# Patient Record
Sex: Female | Born: 1970 | Hispanic: Yes | Marital: Married | Smoking: Never smoker
Health system: Southern US, Community
[De-identification: ages and names within clinical notes are randomized; demographics above are authoritative.]

## PROBLEM LIST (undated history)

## (undated) DIAGNOSIS — R519 Headache, unspecified: Secondary | ICD-10-CM

## (undated) DIAGNOSIS — N939 Abnormal uterine and vaginal bleeding, unspecified: Secondary | ICD-10-CM

## (undated) DIAGNOSIS — E785 Hyperlipidemia, unspecified: Secondary | ICD-10-CM

## (undated) DIAGNOSIS — K760 Fatty (change of) liver, not elsewhere classified: Secondary | ICD-10-CM

## (undated) DIAGNOSIS — Z9229 Personal history of other drug therapy: Secondary | ICD-10-CM

## (undated) DIAGNOSIS — R51 Headache: Secondary | ICD-10-CM

## (undated) DIAGNOSIS — R7303 Prediabetes: Secondary | ICD-10-CM

## (undated) DIAGNOSIS — N6092 Unspecified benign mammary dysplasia of left breast: Secondary | ICD-10-CM

## (undated) HISTORY — DX: Fatty (change of) liver, not elsewhere classified: K76.0

## (undated) HISTORY — PX: BREAST SURGERY: SHX581

## (undated) HISTORY — PX: BREAST EXCISIONAL BIOPSY: SUR124

---

## 2016-08-16 HISTORY — PX: BREAST EXCISIONAL BIOPSY: SUR124

## 2017-01-13 ENCOUNTER — Other Ambulatory Visit: Payer: Self-pay | Admitting: Obstetrics and Gynecology

## 2017-01-13 ENCOUNTER — Other Ambulatory Visit: Payer: Self-pay | Admitting: Obstetrics & Gynecology

## 2017-01-13 DIAGNOSIS — N6489 Other specified disorders of breast: Secondary | ICD-10-CM

## 2017-01-27 ENCOUNTER — Encounter (HOSPITAL_COMMUNITY): Payer: Self-pay | Admitting: *Deleted

## 2017-01-27 ENCOUNTER — Ambulatory Visit (HOSPITAL_COMMUNITY)
Admission: RE | Admit: 2017-01-27 | Discharge: 2017-01-27 | Disposition: A | Discharge status: Home / Self Care | Payer: No Typology Code available for payment source | Source: Ambulatory Visit | Attending: Obstetrics and Gynecology | Admitting: Obstetrics and Gynecology

## 2017-01-27 ENCOUNTER — Other Ambulatory Visit: Payer: Self-pay | Admitting: Obstetrics & Gynecology

## 2017-01-27 ENCOUNTER — Ambulatory Visit
Admission: RE | Admit: 2017-01-27 | Discharge: 2017-01-27 | Disposition: A | Discharge status: Home / Self Care | Payer: Self-pay | Source: Ambulatory Visit | Attending: Obstetrics & Gynecology | Admitting: Obstetrics & Gynecology

## 2017-01-27 ENCOUNTER — Other Ambulatory Visit: Payer: Self-pay | Admitting: Obstetrics and Gynecology

## 2017-01-27 ENCOUNTER — Ambulatory Visit
Admission: RE | Admit: 2017-01-27 | Discharge: 2017-01-27 | Disposition: A | Discharge status: Home / Self Care | Payer: No Typology Code available for payment source | Source: Ambulatory Visit | Attending: Obstetrics & Gynecology | Admitting: Obstetrics & Gynecology

## 2017-01-27 VITALS — BP 110/64 | Ht 62.5 in | Wt 131.4 lb

## 2017-01-27 DIAGNOSIS — N6489 Other specified disorders of breast: Secondary | ICD-10-CM

## 2017-01-27 DIAGNOSIS — R928 Other abnormal and inconclusive findings on diagnostic imaging of breast: Secondary | ICD-10-CM

## 2017-01-27 DIAGNOSIS — Z1239 Encounter for other screening for malignant neoplasm of breast: Secondary | ICD-10-CM

## 2017-01-27 NOTE — Progress Notes (Signed)
[facility] due to recommending a right breast biopsy. Right breast diagnostic mammogram completed 01/13/2017.  Pap Smear: Pap smear not completed today. [facility] and normal per patient. Per patient has no history of an abnormal Pap smear. No Pap smear results are in EPIC.  Physical exam: Breasts Breasts symmetrical. No skin abnormalities bilateral breasts. No nipple retraction bilateral breasts. No nipple discharge bilateral breasts. No lymphadenopathy. No lumps palpated bilateral breasts. No complaints of pain or tenderness on exam. [facility] for a right breast biopsy per recommendation. Appointment scheduled for Thursday, January 27, 2017 at 0930.        Pelvic/Bimanual No Pap smear completed today since last Pap smear was in April 2018 per patient. Pap smear not indicated per BCCCP guidelines.   Smoking History: Patient has never smoked.  Patient Navigation: Patient education provided. Access to services provided for patient through [facility] program. Spanish interpreter provided.  Used Spanish interpreter [employer]anca Lindner from [employer].

## 2017-01-27 NOTE — Patient Instructions (Signed)
Explained breast self awareness with Kathryn Cannon. Patient did not need a Pap smear today due to last Pap smear was in April 2018 per patient. Let her know BCCCP will cover Pap smears every 3 years unless has a history of abnormal Pap smears. [facility] right breast biopsy per recommendation. Appointment scheduled for Thursday, January 27, 2017 at 0930. Kathryn Cannon verbalized understanding.  Melynda Krzywicki, Kathaleen Maserhristine Poll, RN 9:37 AM

## 2017-01-28 ENCOUNTER — Encounter (HOSPITAL_COMMUNITY): Payer: Self-pay | Admitting: *Deleted

## 2017-02-14 ENCOUNTER — Ambulatory Visit
Admission: RE | Admit: 2017-02-14 | Discharge: 2017-02-14 | Disposition: A | Discharge status: Home / Self Care | Payer: No Typology Code available for payment source | Source: Ambulatory Visit | Attending: Obstetrics and Gynecology | Admitting: Obstetrics and Gynecology

## 2017-02-14 DIAGNOSIS — R928 Other abnormal and inconclusive findings on diagnostic imaging of breast: Secondary | ICD-10-CM

## 2017-02-14 MED ORDER — GADOBENATE DIMEGLUMINE 529 MG/ML IV SOLN
12.0000 mL | Freq: Once | INTRAVENOUS | Status: AC | PRN
  Administered 2017-02-14: 12 mL via INTRAVENOUS

## 2017-02-14 MED ORDER — GADOBENATE DIMEGLUMINE 529 MG/ML IV SOLN: 12.0000 mL | Freq: Once | INTRAVENOUS | Status: DC | PRN

## 2017-02-15 ENCOUNTER — Other Ambulatory Visit: Payer: Self-pay | Admitting: Obstetrics and Gynecology

## 2017-02-15 ENCOUNTER — Other Ambulatory Visit (HOSPITAL_COMMUNITY): Payer: Self-pay | Admitting: *Deleted

## 2017-02-15 DIAGNOSIS — N6320 Unspecified lump in the left breast, unspecified quadrant: Secondary | ICD-10-CM

## 2017-02-21 ENCOUNTER — Other Ambulatory Visit (HOSPITAL_COMMUNITY): Payer: Self-pay | Admitting: Obstetrics and Gynecology

## 2017-02-21 DIAGNOSIS — N6320 Unspecified lump in the left breast, unspecified quadrant: Secondary | ICD-10-CM

## 2017-03-02 ENCOUNTER — Ambulatory Visit
Admission: RE | Admit: 2017-03-02 | Discharge: 2017-03-02 | Disposition: A | Discharge status: Home / Self Care | Payer: No Typology Code available for payment source | Source: Ambulatory Visit | Attending: Obstetrics and Gynecology | Admitting: Obstetrics and Gynecology

## 2017-03-02 ENCOUNTER — Other Ambulatory Visit: Payer: No Typology Code available for payment source

## 2017-03-02 ENCOUNTER — Other Ambulatory Visit (HOSPITAL_COMMUNITY): Payer: Self-pay | Admitting: Obstetrics and Gynecology

## 2017-03-02 ENCOUNTER — Inpatient Hospital Stay: Admission: RE | Admit: 2017-03-02 | Payer: No Typology Code available for payment source | Source: Ambulatory Visit

## 2017-03-02 DIAGNOSIS — N6320 Unspecified lump in the left breast, unspecified quadrant: Secondary | ICD-10-CM

## 2017-03-02 MED ORDER — GADOBENATE DIMEGLUMINE 529 MG/ML IV SOLN
12.0000 mL | Freq: Once | INTRAVENOUS | Status: AC | PRN
  Administered 2017-03-02: 12 mL via INTRAVENOUS

## 2017-03-04 ENCOUNTER — Other Ambulatory Visit: Payer: Self-pay | Admitting: Obstetrics & Gynecology

## 2017-03-04 DIAGNOSIS — N649 Disorder of breast, unspecified: Secondary | ICD-10-CM

## 2017-03-16 DIAGNOSIS — Z8742 Personal history of other diseases of the female genital tract: Secondary | ICD-10-CM

## 2017-03-16 HISTORY — DX: Personal history of other diseases of the female genital tract: Z87.42

## 2017-04-04 ENCOUNTER — Ambulatory Visit: Payer: Self-pay | Admitting: General Surgery

## 2017-04-04 ENCOUNTER — Other Ambulatory Visit: Payer: Self-pay | Admitting: General Surgery

## 2017-04-04 DIAGNOSIS — N6092 Unspecified benign mammary dysplasia of left breast: Secondary | ICD-10-CM

## 2017-04-22 NOTE — Pre-Procedure Instructions (Signed)
Kathryn Cannon  04/22/2017      [facility] - [place] - [address] [place] [address] Phone: [phone] Fax: [phone]    Your procedure is scheduled on September 12  [facility] at [employer] A.M.  Call this number if you have problems the morning of surgery:  [phone]   Remember:  Do not eat food or drink liquids after midnight.  Continue all other medications as directed by your physician except follow these medication instructions before surgery   Take these medicines the morning of surgery with A SIP OF WATER acetaminophen (TYLENOL)  7 days prior to surgery STOP taking any Aspirin, Aleve, Naproxen, Ibuprofen, Motrin, Advil, Goody's, BC's, all herbal medications, fish oil, and all vitamins    Do not wear jewelry, make-up or nail polish.  Do not wear lotions, powders, or perfumes, or deoderant.  Do not shave 48 hours prior to surgery.  Do not bring valuables to the hospital.  [facility] is not responsible for any belongings or valuables.  Contacts, dentures or bridgework may not be worn into surgery.  Leave your suitcase in the car.  After surgery it may be brought to your room.  For patients admitted to the hospital, discharge time will be determined by your treatment team.  Patients discharged the day of surgery will not be allowed to drive home.   Special instructions:   [place]- Preparing For Surgery  Before surgery, you can play an important role. Because skin is not sterile, your skin needs to be as free of germs as possible. You can reduce the number of germs on your skin by washing with CHG (chlorahexidine gluconate) Soap before surgery.  CHG is an antiseptic cleaner which kills germs and bonds with the skin to continue killing germs even after washing.  Please do not use if you have an allergy to CHG or antibacterial soaps. If your skin becomes reddened/irritated  stop using the CHG.  Do not shave (including legs and underarms) for at least 48 hours prior to first CHG shower. It is OK to shave your face.  Please follow these instructions carefully.   1. Shower the NIGHT BEFORE SURGERY and the MORNING OF SURGERY with CHG.   2. If you chose to wash your hair, wash your hair first as usual with your normal shampoo.  3. After you shampoo, rinse your hair and body thoroughly to remove the shampoo.  4. Use CHG as you would any other liquid soap. You can apply CHG directly to the skin and wash gently with a scrungie or a clean washcloth.   5. Apply the CHG Soap to your body ONLY FROM THE NECK DOWN.  Do not use on open wounds or open sores. Avoid contact with your eyes, ears, mouth and genitals (private parts). Wash genitals (private parts) with your normal soap.  6. Wash thoroughly, paying special attention to the area where your surgery will be performed.  7. Thoroughly rinse your body with warm water from the neck down.  8. DO NOT shower/wash with your normal soap after using and rinsing off the CHG Soap.  9. Pat yourself dry with a CLEAN TOWEL.   10. Wear CLEAN PAJAMAS   11. Place CLEAN SHEETS on your bed the night of your first shower and DO NOT SLEEP WITH PETS.    Day of Surgery: Do not apply any deodorants/lotions. Please wear clean clothes to the  hospital/surgery center.      Please read over the following fact sheets that you were given.

## 2017-04-25 ENCOUNTER — Encounter (HOSPITAL_COMMUNITY): Payer: Self-pay

## 2017-04-25 ENCOUNTER — Ambulatory Visit (HOSPITAL_COMMUNITY)
Admission: RE | Admit: 2017-04-25 | Discharge: 2017-04-25 | Disposition: A | Discharge status: Home / Self Care | Payer: No Typology Code available for payment source | Source: Ambulatory Visit | Attending: General Surgery | Admitting: General Surgery

## 2017-04-25 DIAGNOSIS — N6082 Other benign mammary dysplasias of left breast: Secondary | ICD-10-CM | POA: Insufficient documentation

## 2017-04-25 DIAGNOSIS — Z01818 Encounter for other preprocedural examination: Secondary | ICD-10-CM | POA: Insufficient documentation

## 2017-04-25 HISTORY — DX: Headache, unspecified: R51.9

## 2017-04-25 HISTORY — DX: Headache: R51

## 2017-04-25 LAB — CBC
HCT: 35.5 % — ABNORMAL LOW (ref 36.0–46.0)
HEMOGLOBIN: 11.2 g/dL — AB (ref 12.0–15.0)
MCH: 25.6 pg — AB (ref 26.0–34.0)
MCHC: 31.5 g/dL (ref 30.0–36.0)
MCV: 81.2 fL (ref 78.0–100.0)
Platelets: 303 10*3/uL (ref 150–400)
RBC: 4.37 MIL/uL (ref 3.87–5.11)
RDW: 16.8 % — ABNORMAL HIGH (ref 11.5–15.5)
WBC: 6.4 10*3/uL (ref 4.0–10.5)

## 2017-04-25 LAB — HCG, SERUM, QUALITATIVE: PREG SERUM: NEGATIVE

## 2017-04-25 NOTE — Pre-Procedure Instructions (Addendum)
Kathryn Cannon  04/25/2017      [facility] [place] - [address] [place] [address] Phone: [phone] Fax: [phone]    Your procedure is scheduled on September 12  [facility] Ad[facility] at [employer] A.M.  Call this number if you have problems the morning of surgery:  [phone]   Remember:  Do not eat food or drink liquids after midnight.   Continue all other medications as directed by your physician except follow these medication instructions before surgery   Take these medicines the morning of surgery with A SIP OF WATER acetaminophen (TYLENOL)  Starting today 04/25/17  STOP taking any Aspirin, Aleve, Naproxen, Ibuprofen, Motrin, Advil, Goody's, BC's, all herbal medications, fish oil, and all vitamins    Do not wear jewelry, make-up or nail polish.  Do not wear lotions, powders, or perfumes, or deoderant.  Do not shave 48 hours prior to surgery.  Do not bring valuables to the hospital.  [facility] is not responsible for any belongings or valuables.  Contacts, dentures or bridgework may not be worn into surgery.  Leave your suitcase in the car.  After surgery it may be brought to your room.  For patients admitted to the hospital, discharge time will be determined by your treatment team.  Patients discharged the day of surgery will not be allowed to drive home.   Special instructions:   [place]- Preparing For Surgery  Before surgery, you can play an important role. Because skin is not sterile, your skin needs to be as free of germs as possible. You can reduce the number of germs on your skin by washing with CHG (chlorahexidine gluconate) Soap before surgery.  CHG is an antiseptic cleaner which kills germs and bonds with the skin to continue killing germs even after washing.  Please do not use if you have an allergy to CHG or antibacterial soaps. If your skin becomes  reddened/irritated stop using the CHG.  Do not shave (including legs and underarms) for at least 48 hours prior to first CHG shower. It is OK to shave your face.  Please follow these instructions carefully.   1. Shower the NIGHT BEFORE SURGERY and the MORNING OF SURGERY with CHG.   2. If you chose to wash your hair, wash your hair first as usual with your normal shampoo.  3. After you shampoo, rinse your hair and body thoroughly to remove the shampoo.  4. Use CHG as you would any other liquid soap. You can apply CHG directly to the skin and wash gently with a scrungie or a clean washcloth.   5. Apply the CHG Soap to your body ONLY FROM THE NECK DOWN.  Do not use on open wounds or open sores. Avoid contact with your eyes, ears, mouth and genitals (private parts). Wash genitals (private parts) with your normal soap.  6. Wash thoroughly, paying special attention to the area where your surgery will be performed.  7. Thoroughly rinse your body with warm water from the neck down.  8. DO NOT shower/wash with your normal soap after using and rinsing off the CHG Soap.  9. Pat yourself dry with a CLEAN TOWEL.   10. Wear CLEAN PAJAMAS   11. Place CLEAN SHEETS on your bed the night of your first shower and DO NOT SLEEP WITH PETS.    Day of Surgery: Do not apply any deodorants/lotions. Please wear clean clothes to the  hospital/surgery center.      Please read over the  fact sheets that you were given.

## 2017-04-25 NOTE — Progress Notes (Signed)
Interpreter services used on stratus-cynthia [id number]

## 2017-04-26 ENCOUNTER — Ambulatory Visit
Admission: RE | Admit: 2017-04-26 | Discharge: 2017-04-26 | Disposition: A | Discharge status: Home / Self Care | Payer: No Typology Code available for payment source | Source: Ambulatory Visit | Attending: General Surgery | Admitting: General Surgery

## 2017-04-26 DIAGNOSIS — N6092 Unspecified benign mammary dysplasia of left breast: Secondary | ICD-10-CM

## 2017-04-26 NOTE — Anesthesia Preprocedure Evaluation (Addendum)
Anesthesia Evaluation  Patient identified by MRN, date of birth, ID band Patient awake    Reviewed: Allergy & Precautions, H&P , NPO status , Patient's Chart, lab work & pertinent test results  Airway Mallampati: II  TM Distance: >3 FB Neck ROM: Full    Dental no notable dental hx. (+) Teeth Intact, Dental Advisory Given   Pulmonary neg pulmonary ROS,    Pulmonary exam normal breath sounds clear to auscultation       Cardiovascular negative cardio ROS   Rhythm:Regular Rate:Normal     Neuro/Psych  Headaches, negative psych ROS   GI/Hepatic negative GI ROS, Neg liver ROS,   Endo/Other  negative endocrine ROS  Renal/GU negative Renal ROS  negative genitourinary   Musculoskeletal   Abdominal   Peds  Hematology negative hematology ROS (+)   Anesthesia Other Findings   Reproductive/Obstetrics negative OB ROS                            Anesthesia Physical Anesthesia Plan  ASA: II  Anesthesia Plan: General   Post-op Pain Management:    Induction: Intravenous  PONV Risk Score and Plan: 4 or greater and Ondansetron, Dexamethasone and Midazolam  Airway Management Planned: LMA  Additional Equipment:   Intra-op Plan:   Post-operative Plan: Extubation in OR  Informed Consent: I have reviewed the patients History and Physical, chart, labs and discussed the procedure including the risks, benefits and alternatives for the proposed anesthesia with the patient or authorized representative who has indicated his/her understanding and acceptance.     Dental advisory given  Plan Discussed with: CRNA  Anesthesia Plan Comments:         Anesthesia Quick Evaluation  

## 2017-04-27 ENCOUNTER — Encounter (HOSPITAL_COMMUNITY)
Admission: RE | Disposition: A | Discharge status: Home / Self Care | Payer: Self-pay | Source: Ambulatory Visit | Attending: General Surgery

## 2017-04-27 ENCOUNTER — Ambulatory Visit (HOSPITAL_COMMUNITY): Payer: Self-pay | Admitting: Anesthesiology

## 2017-04-27 ENCOUNTER — Encounter (HOSPITAL_COMMUNITY): Payer: Self-pay | Admitting: Certified Registered Nurse Anesthetist

## 2017-04-27 ENCOUNTER — Ambulatory Visit (HOSPITAL_COMMUNITY)
Admission: RE | Admit: 2017-04-27 | Discharge: 2017-04-27 | Disposition: A | Discharge status: Home / Self Care | Payer: Self-pay | Source: Ambulatory Visit | Attending: General Surgery | Admitting: General Surgery

## 2017-04-27 ENCOUNTER — Ambulatory Visit
Admission: RE | Admit: 2017-04-27 | Discharge: 2017-04-27 | Disposition: A | Discharge status: Home / Self Care | Payer: No Typology Code available for payment source | Source: Ambulatory Visit | Attending: General Surgery | Admitting: General Surgery

## 2017-04-27 DIAGNOSIS — N6082 Other benign mammary dysplasias of left breast: Secondary | ICD-10-CM | POA: Insufficient documentation

## 2017-04-27 DIAGNOSIS — N6092 Unspecified benign mammary dysplasia of left breast: Secondary | ICD-10-CM

## 2017-04-27 DIAGNOSIS — N6022 Fibroadenosis of left breast: Secondary | ICD-10-CM | POA: Insufficient documentation

## 2017-04-27 HISTORY — DX: Unspecified benign mammary dysplasia of left breast: N60.92

## 2017-04-27 HISTORY — PX: BREAST LUMPECTOMY WITH RADIOACTIVE SEED LOCALIZATION: SHX6424

## 2017-04-27 SURGERY — BREAST LUMPECTOMY WITH RADIOACTIVE SEED LOCALIZATION
Anesthesia: General | Site: Breast | Laterality: Left

## 2017-04-27 MED ORDER — CEFAZOLIN SODIUM-DEXTROSE 2-4 GM/100ML-% IV SOLN
2.0000 g | INTRAVENOUS | Status: AC
  Administered 2017-04-27: 2 g via INTRAVENOUS
  Filled 2017-04-27: qty 100

## 2017-04-27 MED ORDER — MIDAZOLAM HCL 5 MG/5ML IJ SOLN
INTRAMUSCULAR | Status: DC | PRN
  Administered 2017-04-27: 2 mg via INTRAVENOUS

## 2017-04-27 MED ORDER — PROPOFOL 10 MG/ML IV BOLUS
INTRAVENOUS | Status: DC | PRN
  Administered 2017-04-27: 140 mg via INTRAVENOUS

## 2017-04-27 MED ORDER — LIDOCAINE 2% (20 MG/ML) 5 ML SYRINGE
INTRAMUSCULAR | Status: AC
  Filled 2017-04-27: qty 5

## 2017-04-27 MED ORDER — CELECOXIB 200 MG PO CAPS
400.0000 mg | ORAL_CAPSULE | ORAL | Status: AC
  Administered 2017-04-27: 400 mg via ORAL
  Filled 2017-04-27: qty 2

## 2017-04-27 MED ORDER — 0.9 % SODIUM CHLORIDE (POUR BTL) OPTIME
TOPICAL | Status: DC | PRN
Start: 2017-04-27 — End: 2017-04-27
  Administered 2017-04-27: 1000 mL

## 2017-04-27 MED ORDER — FENTANYL CITRATE (PF) 250 MCG/5ML IJ SOLN
INTRAMUSCULAR | Status: AC
  Filled 2017-04-27: qty 5

## 2017-04-27 MED ORDER — ONDANSETRON HCL 4 MG/2ML IJ SOLN
INTRAMUSCULAR | Status: DC | PRN
  Administered 2017-04-27: 4 mg via INTRAVENOUS

## 2017-04-27 MED ORDER — LIDOCAINE 2% (20 MG/ML) 5 ML SYRINGE
INTRAMUSCULAR | Status: DC | PRN
  Administered 2017-04-27: 60 mg via INTRAVENOUS

## 2017-04-27 MED ORDER — LACTATED RINGERS IV SOLN
INTRAVENOUS | Status: DC | PRN
  Administered 2017-04-27: 08:00:00 via INTRAVENOUS

## 2017-04-27 MED ORDER — HYDROMORPHONE HCL 1 MG/ML IJ SOLN
INTRAMUSCULAR | Status: AC
  Filled 2017-04-27: qty 1

## 2017-04-27 MED ORDER — CHLORHEXIDINE GLUCONATE CLOTH 2 % EX PADS
6.0000 | MEDICATED_PAD | Freq: Once | CUTANEOUS | Status: DC
Start: 2017-04-27 — End: 2017-04-27

## 2017-04-27 MED ORDER — PROPOFOL 10 MG/ML IV BOLUS
INTRAVENOUS | Status: AC
  Filled 2017-04-27: qty 40

## 2017-04-27 MED ORDER — HYDROMORPHONE HCL 1 MG/ML IJ SOLN
0.2500 mg | INTRAMUSCULAR | Status: DC | PRN
  Administered 2017-04-27: 0.5 mg via INTRAVENOUS

## 2017-04-27 MED ORDER — CHLORHEXIDINE GLUCONATE CLOTH 2 % EX PADS: 6.0000 | MEDICATED_PAD | Freq: Once | CUTANEOUS | Status: DC

## 2017-04-27 MED ORDER — DEXAMETHASONE SODIUM PHOSPHATE 10 MG/ML IJ SOLN
INTRAMUSCULAR | Status: AC
  Filled 2017-04-27: qty 1

## 2017-04-27 MED ORDER — BUPIVACAINE-EPINEPHRINE (PF) 0.25% -1:200000 IJ SOLN
INTRAMUSCULAR | Status: AC
  Filled 2017-04-27: qty 30

## 2017-04-27 MED ORDER — ONDANSETRON HCL 4 MG/2ML IJ SOLN
INTRAMUSCULAR | Status: AC
  Filled 2017-04-27: qty 2

## 2017-04-27 MED ORDER — DEXAMETHASONE SODIUM PHOSPHATE 10 MG/ML IJ SOLN
INTRAMUSCULAR | Status: DC | PRN
  Administered 2017-04-27: 10 mg via INTRAVENOUS

## 2017-04-27 MED ORDER — GABAPENTIN 300 MG PO CAPS
300.0000 mg | ORAL_CAPSULE | ORAL | Status: AC
  Administered 2017-04-27: 300 mg via ORAL
  Filled 2017-04-27: qty 1

## 2017-04-27 MED ORDER — BUPIVACAINE-EPINEPHRINE 0.25% -1:200000 IJ SOLN
INTRAMUSCULAR | Status: DC | PRN
Start: 2017-04-27 — End: 2017-04-27
  Administered 2017-04-27: 20 mL

## 2017-04-27 MED ORDER — FENTANYL CITRATE (PF) 100 MCG/2ML IJ SOLN
INTRAMUSCULAR | Status: DC | PRN
  Administered 2017-04-27: 25 ug via INTRAVENOUS
  Administered 2017-04-27: 50 ug via INTRAVENOUS

## 2017-04-27 MED ORDER — ACETAMINOPHEN 500 MG PO TABS
1000.0000 mg | ORAL_TABLET | ORAL | Status: AC
  Administered 2017-04-27: 1000 mg via ORAL
  Filled 2017-04-27: qty 2

## 2017-04-27 MED ORDER — MIDAZOLAM HCL 2 MG/2ML IJ SOLN
INTRAMUSCULAR | Status: AC
  Filled 2017-04-27: qty 2

## 2017-04-27 MED ORDER — HYDROCODONE-ACETAMINOPHEN 5-325 MG PO TABS: 1.0000 | ORAL_TABLET | ORAL | 0 refills | Status: DC | PRN

## 2017-04-27 SURGICAL SUPPLY — 37 items
APPLIER CLIP 9.375 MED OPEN (MISCELLANEOUS)
BLADE SURG 15 STRL LF DISP TIS (BLADE) ×1 IMPLANT
BLADE SURG 15 STRL SS (BLADE) ×2
CANISTER SUCT 3000ML PPV (MISCELLANEOUS) ×3 IMPLANT
CHLORAPREP W/TINT 26ML (MISCELLANEOUS) ×3 IMPLANT
CLIP APPLIE 9.375 MED OPEN (MISCELLANEOUS) IMPLANT
COVER PROBE W GEL 5X96 (DRAPES) ×3 IMPLANT
COVER SURGICAL LIGHT HANDLE (MISCELLANEOUS) ×3 IMPLANT
DERMABOND ADVANCED (GAUZE/BANDAGES/DRESSINGS) ×2
DERMABOND ADVANCED .7 DNX12 (GAUZE/BANDAGES/DRESSINGS) ×1 IMPLANT
DEVICE DUBIN SPECIMEN MAMMOGRA (MISCELLANEOUS) ×3 IMPLANT
DRAPE CHEST BREAST 15X10 FENES (DRAPES) ×3 IMPLANT
DRAPE UTILITY XL STRL (DRAPES) ×3 IMPLANT
ELECT COATED BLADE 2.86 ST (ELECTRODE) ×3 IMPLANT
ELECT REM PT RETURN 9FT ADLT (ELECTROSURGICAL) ×3
ELECTRODE REM PT RTRN 9FT ADLT (ELECTROSURGICAL) ×1 IMPLANT
GLOVE BIO SURGEON STRL SZ7.5 (GLOVE) ×6 IMPLANT
GOWN STRL REUS W/ TWL LRG LVL3 (GOWN DISPOSABLE) ×2 IMPLANT
GOWN STRL REUS W/TWL LRG LVL3 (GOWN DISPOSABLE) ×4
KIT BASIN OR (CUSTOM PROCEDURE TRAY) ×3 IMPLANT
KIT MARKER MARGIN INK (KITS) ×3 IMPLANT
LIGHT WAVEGUIDE WIDE FLAT (MISCELLANEOUS) IMPLANT
NEEDLE HYPO 25GX1X1/2 BEV (NEEDLE) ×3 IMPLANT
NS IRRIG 1000ML POUR BTL (IV SOLUTION) ×3 IMPLANT
PACK SURGICAL SETUP 50X90 (CUSTOM PROCEDURE TRAY) ×3 IMPLANT
PENCIL BUTTON HOLSTER BLD 10FT (ELECTRODE) ×3 IMPLANT
SPONGE LAP 18X18 X RAY DECT (DISPOSABLE) ×3 IMPLANT
SUT MNCRL AB 4-0 PS2 18 (SUTURE) ×3 IMPLANT
SUT SILK 2 0 SH (SUTURE) IMPLANT
SUT VIC AB 3-0 SH 18 (SUTURE) ×3 IMPLANT
SYR BULB 3OZ (MISCELLANEOUS) ×3 IMPLANT
SYR CONTROL 10ML LL (SYRINGE) ×3 IMPLANT
TOWEL OR 17X24 6PK STRL BLUE (TOWEL DISPOSABLE) ×3 IMPLANT
TOWEL OR 17X26 10 PK STRL BLUE (TOWEL DISPOSABLE) ×3 IMPLANT
TUBE CONNECTING 12'X1/4 (SUCTIONS) ×1
TUBE CONNECTING 12X1/4 (SUCTIONS) ×2 IMPLANT
YANKAUER SUCT BULB TIP NO VENT (SUCTIONS) ×3 IMPLANT

## 2017-04-27 NOTE — Anesthesia Procedure Notes (Signed)
Procedure Name: LMA Insertion Date/Time: 04/27/2017 8:34 AM Performed by: Jed LimerickHARDER, Doralee Kocak S Pre-anesthesia Checklist: Patient identified, Emergency Drugs available, Suction available and Patient being monitored Patient Re-evaluated:Patient Re-evaluated prior to induction Oxygen Delivery Method: Circle System Utilized Preoxygenation: Pre-oxygenation with 100% oxygen Induction Type: IV induction Ventilation: Mask ventilation without difficulty LMA: LMA inserted LMA Size: 4.0 Number of attempts: 1 Placement Confirmation: positive ETCO2 Tube secured with: Tape Dental Injury: Teeth and Oropharynx as per pre-operative assessment

## 2017-04-27 NOTE — Op Note (Signed)
04/27/2017  9:19 AM  PATIENT:  Kathryn Cannon  46 y.o. female  PRE-OPERATIVE DIAGNOSIS:  LEFT BREAST ATYPICAL DUCTAL HYPERPLASIA  POST-OPERATIVE DIAGNOSIS:  LEFT BREAST ATYPICAL DUCTAL HYPERPLASIA  PROCEDURE:  Procedure(s): RADIOCATIVE SEED GUIDED LEFT BREAST LUMPECTOMY  SURGEON:  Surgeon(s) and Role:    * Griselda Mineroth, Paul III, MD - Primary  PHYSICIAN ASSISTANT:   ASSISTANTS: none   ANESTHESIA:   local and general  EBL:  Total I/O In: 600 [I.V.:600] Out: 2 [Blood:2]  BLOOD ADMINISTERED:none  DRAINS: none   LOCAL MEDICATIONS USED:  MARCAINE     SPECIMEN:  Source of Specimen:  left breast tissue  DISPOSITION OF SPECIMEN:  PATHOLOGY  COUNTS:  YES  TOURNIQUET:  * No tourniquets in log *  DICTATION: .Dragon Dictation   After informed consent was obtained the patient was brought to the operating room and placed in the supine position on the operating table. After adequate induction of general anesthesia the patient's left breast was prepped with ChloraPrep, allowed to dry, and draped in usual sterile manner. An appropriate timeout was performed. Previously an I-125 seed was placed in the upper inner aspect of the left breast to mark an area of atypical ductal hyperplasia. The neoprobe was set to I-125 in the area of radioactivity was readily identified. The area around this was infiltrated with quarter percent Marcaine. A curvilinear incision was made along the upper inner edge of the areola of the left breast with a 15 blade knife. The incision was carried through the skin and subcutaneous tissue sharply with electrocautery. Dissection was then carried towards the radioactive seed under the direction of the neoprobe. Once I approached the radioactive seed then a circular portion of breast tissue was then excised sharply around the radioactive seed while checking the area of radioactivity frequently. Once the specimen was removed it was oriented with the appropriate paint  colors. A specimen radiograph was obtained that showed the clip and seed to be within the center of the specimen. Hemostasis was achieved using the Bovie electrocautery. The wound was then infiltrated with more quarter percent Marcaine and irrigated with saline. The deep layer of the wound was then closed with layers of interrupted 3-0 Vicryl stitches. The skin was then closed with interrupted 4-0 Monocryl subcuticular stitches. Dermabond dressings were applied. The patient tolerated the procedure well. At the end of the case all needle sponge and instrument counts were correct. The patient was then awakened and taken to recovery in stable condition.  PLAN OF CARE: Discharge to home after PACU  PATIENT DISPOSITION:  PACU - hemodynamically stable.   Delay start of Pharmacological VTE agent (>24hrs) due to surgical blood loss or risk of bleeding: not applicable

## 2017-04-27 NOTE — Anesthesia Postprocedure Evaluation (Signed)
Anesthesia Post Note  Patient: Kathryn Cannon  Procedure(s) Performed: Procedure(s) (LRB): RADIOCATIVE SEED GUIDED LEFT BREAST LUMPECTOMY, ERAS PATHWAY (Left)     Patient location during evaluation: PACU Anesthesia Type: General Level of consciousness: awake and alert Pain management: pain level controlled Vital Signs Assessment: post-procedure vital signs reviewed and stable Respiratory status: spontaneous breathing, nonlabored ventilation and respiratory function stable Cardiovascular status: blood pressure returned to baseline and stable Postop Assessment: no signs of nausea or vomiting Anesthetic complications: no    Last Vitals:  Vitals:   04/27/17 1030 04/27/17 1040  BP: 112/78 116/73  Pulse: 70 70  Resp: 16 14  Temp:  36.8 C  SpO2: 99% 99%    Last Pain:  Vitals:   04/27/17 1045  TempSrc:   PainSc: 0-No pain                 Chelesea Weiand,W. EDMOND

## 2017-04-27 NOTE — Progress Notes (Signed)
Interpreter Graciela Namihira for pre surgery   °

## 2017-04-27 NOTE — Transfer of Care (Signed)
Immediate Anesthesia Transfer of Care Note  Patient: Kathryn Cannon  Procedure(s) Performed: Procedure(s): RADIOCATIVE SEED GUIDED LEFT BREAST LUMPECTOMY, ERAS PATHWAY (Left)  Patient Location: PACU  Anesthesia Type:General  Level of Consciousness: awake, alert  and oriented  Airway & Oxygen Therapy: Patient Spontanous Breathing and Patient connected to nasal cannula oxygen  Post-op Assessment: Report given to RN and Post -op Vital signs reviewed and stable  Post vital signs: Reviewed and stable  Last Vitals:  Vitals:   04/27/17 0655  BP: 131/74  Pulse: 75  Resp: 18  Temp: 36.9 C  SpO2: 100%    Last Pain:  Vitals:   04/27/17 0655  TempSrc: Oral      Patients Stated Pain Goal: 3 (04/27/17 0655)  Complications: No apparent anesthesia complications

## 2017-04-27 NOTE — Progress Notes (Signed)
Interpreter in to see pt 6/10 paon score medicated for pain

## 2017-04-27 NOTE — Interval H&P Note (Signed)
History and Physical Interval Note:  04/27/2017 8:09 AM  Kathryn Cannon  has presented today for surgery, with the diagnosis of left breast atypical ductal hyperplasia  The various methods of treatment have been discussed with the patient and family. After consideration of risks, benefits and other options for treatment, the patient has consented to  Procedure(s): LEFT BREAST LUMPECTOMY WITH RADIOACTIVE SEED LOCALIZATION ERAS PATHWAY (Left) as a surgical intervention .  The patient's history has been reviewed, patient examined, no change in status, stable for surgery.  I have reviewed the patient's chart and labs.  Questions were answered to the patient's satisfaction.     TOTH III,Amiliana Foutz S

## 2017-04-27 NOTE — H&P (Signed)
Kathryn Cannon  Location: [facility] Patient #: [id number] DOB: 03/28/1971 Married / Language: Spanish / Race: Undefined Female   History of Present Illness  The patient is a 46 year old female who presents with a breast mass. We are asked to see the patient in consultation by Dr. Derinda LateJarosz to evaluate her for atypical ductal hyperplasia of the left breast. The patient is a 46 year old Hispanic female who recently went for a routine screening mammogram. At that time she was found to have 2 abnormalities in the upper left breast. 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 The upper inner area was found to be atypical duct hyperplasia. She denies any personal or family history of breast cancer. She is otherwise healthy.   Past Surgical History Breast Biopsy  Left.  Allergies NKDA  Medication History No Current Medications Medications Reconciled  Social History  No alcohol use  No caffeine use  No drug use  Tobacco use  Never smoker.  Pregnancy / Birth History  Gravida  7 Maternal age  44-20 Para  3 Regular periods     Review of Systems  Skin Not Present- Change in Wart/Mole, Dryness, Hives, Jaundice, New Lesions, Non-Healing Wounds, Rash and Ulcer. Respiratory Not Present- Bloody sputum, Chronic Cough, Difficulty Breathing, Snoring and Wheezing. Breast Not Present- Breast Mass, Breast Pain, Nipple Discharge and Skin Changes. Cardiovascular Not Present- Chest Pain, Difficulty Breathing Lying Down, Leg Cramps, Palpitations, Rapid Heart Rate, Shortness of Breath and Swelling of Extremities. Gastrointestinal Not Present- Abdominal Pain, Bloating, Bloody Stool, Change in Bowel Habits, Chronic diarrhea, Constipation, Difficulty Swallowing, Excessive gas, Gets full quickly at meals, Hemorrhoids, Indigestion, Nausea, Rectal Pain and Vomiting. Musculoskeletal Not Present- Back Pain, Joint Pain, Joint Stiffness, Muscle Pain,  Muscle Weakness and Swelling of Extremities.  Vitals  Weight: 135 lb Height: 61.5in Body Surface Area: 1.61 m Body Mass Index: 25.09 kg/m  Temp.: 97.57F  Pulse: 47 (Regular)  BP: 106/70 (Sitting, Left Arm, Standard)       Physical Exam  General Mental Status-Alert. General Appearance-Consistent with stated age. Hydration-Well hydrated. Voice-Normal.  Head and Neck Head-normocephalic, atraumatic with no lesions or palpable masses. Trachea-midline. Thyroid Gland Characteristics - normal size and consistency.  Eye Eyeball - Bilateral-Extraocular movements intact. Sclera/Conjunctiva - Bilateral-No scleral icterus.  Chest and Lung Exam Chest and lung exam reveals -quiet, even and easy respiratory effort with no use of accessory muscles and on auscultation, normal breath sounds, no adventitious sounds and normal vocal resonance. Inspection Chest Wall - Normal. Back - normal.  Breast Note: There is no palpable mass in either breast. There is no palpable axillary, supraclavicular, or cervical lymphadenopathy.   Cardiovascular Cardiovascular examination reveals -normal heart sounds, regular rate and rhythm with no murmurs and normal pedal pulses bilaterally.  Abdomen Inspection Inspection of the abdomen reveals - No Hernias. Skin - Scar - no surgical scars. Palpation/Percussion Palpation and Percussion of the abdomen reveal - Soft, Non Tender, No Rebound tenderness, No Rigidity (guarding) and No hepatosplenomegaly. Auscultation Auscultation of the abdomen reveals - Bowel sounds normal.  Neurologic Neurologic evaluation reveals -alert and oriented x 3 with no impairment of recent or remote memory. Mental Status-Normal.  Musculoskeletal Normal Exam - Left-Upper Extremity Strength Normal and Lower Extremity Strength Normal. Normal Exam - Right-Upper Extremity Strength Normal and Lower Extremity Strength Normal.  Lymphatic Head  & Neck  General Head & Neck Lymphatics: Bilateral - Description - Normal. Axillary  General Axillary Region: Bilateral - Description -  Normal. Tenderness - Non Tender. Femoral & Inguinal  Generalized Femoral & Inguinal Lymphatics: Bilateral - Description - Normal. Tenderness - Non Tender.    Assessment & Plan  ATYPICAL DUCTAL HYPERPLASIA OF LEFT BREAST (N60.92) Impression: The patient appears to have a small area of atypical ductal hyperplasia in the left upper inner quadrant. Because of its abnormal appearance and because it is considered a high risk lesion and I would recommend having this area removed. I have discussed with her in detail the risks and benefits of the operation to do this as well as some of the technical aspects and she understands and wishes to proceed. I will plan for a left breast radioactive seed localized lumpectomy. We discussed all this through the interpreter and she understands and her questions were answered to her satisfaction.

## 2017-04-28 ENCOUNTER — Encounter (HOSPITAL_COMMUNITY): Payer: Self-pay | Admitting: General Surgery

## 2017-05-16 ENCOUNTER — Encounter: Payer: Self-pay | Admitting: Oncology

## 2017-05-16 ENCOUNTER — Telehealth: Payer: Self-pay | Admitting: Oncology

## 2017-05-16 NOTE — Telephone Encounter (Signed)
Appt has been scheduled for the pt to see Dr. Darnelle Catalan on 10/29 at 4pm. Letter mailed to the pt.

## 2017-06-13 ENCOUNTER — Ambulatory Visit (HOSPITAL_BASED_OUTPATIENT_CLINIC_OR_DEPARTMENT_OTHER): Payer: Self-pay | Admitting: Oncology

## 2017-06-13 ENCOUNTER — Telehealth: Payer: Self-pay | Admitting: Oncology

## 2017-06-13 DIAGNOSIS — Z79810 Long term (current) use of selective estrogen receptor modulators (SERMs): Secondary | ICD-10-CM

## 2017-06-13 DIAGNOSIS — N6092 Unspecified benign mammary dysplasia of left breast: Secondary | ICD-10-CM | POA: Insufficient documentation

## 2017-06-13 MED ORDER — TAMOXIFEN CITRATE 20 MG PO TABS: 20.0000 mg | ORAL_TABLET | Freq: Every day | ORAL | 12 refills | Status: AC

## 2017-06-13 NOTE — Telephone Encounter (Signed)
Gave patient avs and calendar with appts per 10/29 los. °

## 2017-06-13 NOTE — Progress Notes (Signed)
[place]  Telephone:([phone] Fax:(336) [phone]     ID: Kathryn Cannon DOB: Jul 26, 1971  MR#: [id number]  JYN#:[id number]  Patient Care Team: Kathryn Bellman, MD as PCP - General (Obstetrics and Gynecology) Kathryn Cannon, Kathryn Dad, MD as Consulting Physician (Oncology) Kathryn Kussmaul, MD as Consulting Physician (General Surgery) Kathryn Cruel, MD OTHER MD:  CHIEF COMPLAINT: Atypical ductal hyperplasia  CURRENT TREATMENT: To start tamoxifen   HISTORY OF CURRENT ILLNESS: The patient had screening mammography Dec 28, 2016 showing a possible change in the right breast.  On 01/12/2017 she had right diagnostic mammography with tomography and right breast ultrasonography at [facility] in [place].  This showed the breast density to be category C.  In the upper inner right breast there was an area of persistent distortion.  However there was no ultrasound correlate.  The right axilla was sonographically benign.   On 01/27/2017 the patient attempted right breast biopsy under tomo guidance but the abnormality could not be located so she proceeded to bilateral breast MRI February 14, 2017.  In the right breast there was no MRI correlate for the possible area of architectural distortion.  In the left breast however there was an oval circumscribed mass in the upper inner quadrant measuring 0.7 cm.  There was a second focal area of non-mass-like enhancement in the upper outer quadrant of the left breast measuring 0.8 cm.  There were no abnormal-appearing lymph nodes.  Biopsy of the 2 left breast areas in question was performed March 02, 2017 and showed ([id number] fibrocystic change in the more outer biopsy and atypical ductal hyperplasia in the more inner located biopsy.  The patient then proceeded to left lumpectomy on April 27, 2017.  This showed ([id number]) fibrocystic changes, but no evidence of malignancy.  She was referred to the high risk clinic for further  evaluation  The patient's subsequent history is as detailed below.  INTERVAL HISTORY: I met with the patient and her husband Kathryn Cannon in the high-risk clinic June 13, 2017   REVIEW OF SYSTEMS: There were no specific symptoms leading to the original mammogram, which was routinely scheduled. The patient denies unusual headaches, visual changes, nausea, vomiting, stiff neck, dizziness, or gait imbalance. There has been no cough, phlegm production, or pleurisy, no chest pain or pressure, and no change in bowel or bladder habits. The patient denies fever, rash, bleeding, unexplained fatigue or unexplained weight loss. A detailed review of systems was otherwise entirely negative.   PAST MEDICAL HISTORY: Past Medical History:  Diagnosis Date  . Atypical ductal hyperplasia of left breast   . Headache     PAST SURGICAL HISTORY: Past Surgical History:  Procedure Laterality Date  . BREAST LUMPECTOMY WITH RADIOACTIVE SEED LOCALIZATION Left 04/27/2017   Procedure: RADIOCATIVE SEED GUIDED LEFT BREAST LUMPECTOMY, ERAS PATHWAY;  Surgeon: Kathryn Kussmaul, MD;  Location: [facility];  Service: General;  Laterality: Left;  . CESAREAN SECTION      FAMILY HISTORY Family History  Problem Relation Age of Onset  . Diabetes Paternal Aunt   As of October 2018 the patient's father is 75 years old and her mother 51 years old.  The patient has 6 brothers and 3 sisters.  There is no history of cancer in the family or the extended family to the best of her knowledge  GYNECOLOGIC HISTORY:  No LMP recorded. Menarche age 64, first live birth age 16, she is Kathryn Cannon.  She is still having regular periods, which usually last  3-4 days, 1 of which is heavy  SOCIAL HISTORY:  She is originally from [place] as is her husband.  They have been in the hospital area more than 20 years.  She is a housewife.  There are 3 sons are 58, living in [place], [place], living in [place], and 44 years old    ADVANCED DIRECTIVES:     HEALTH MAINTENANCE: Social History  Substance Use Topics  . Smoking status: Never Smoker  . Smokeless tobacco: Never Used  . Alcohol use No     Colonoscopy:  PAP:  Bone density:   No Known Allergies  Current Outpatient Prescriptions  Medication Sig Dispense Refill  . acetaminophen (TYLENOL) 500 MG tablet Take 1,000 mg by mouth 2 (two) times daily as needed for mild pain or headache.    Marland Kitchen HYDROcodone-acetaminophen (NORCO/VICODIN) 5-325 MG tablet Take 1-2 tablets by mouth every 4 (four) hours as needed for moderate pain or severe pain. 10 tablet 0  . Magnesium Oxide 500 MG CAPS Take 500 mg by mouth daily.    . tamoxifen (NOLVADEX) 20 MG tablet Take 1 tablet (20 mg total) by mouth daily. 90 tablet 12   No current facility-administered medications for this visit.     OBJECTIVE: Hispanic woman who appears well  Vitals:   06/13/17 1555  BP: 125/66  Pulse: 71  Resp: 18  Temp: 98.2 F (36.8 C)  SpO2: 100%     Body mass index is 23.55 kg/m.   Wt Readings from Last 3 Encounters:  06/13/17 141 lb 8 oz (64.2 kg)  04/27/17 137 lb (62.1 kg)  04/25/17 137 lb 6 oz (62.3 kg)      ECOG FS:0 - Asymptomatic  Ocular: Sclerae unicteric, pupils round and equal Ear-nose-throat: Oropharynx clear and moist Lymphatic: No cervical or supraclavicular adenopathy Lungs no rales or rhonchi Heart regular rate and rhythm Abd soft, nontender, positive bowel sounds MSK no focal spinal tenderness, no joint edema Neuro: non-focal, well-oriented, appropriate affect Breasts: The right breast is unremarkable.  The left breast is status post recent lumpectomy.  The cosmetic result is excellent.  The incision is healing very nicely.  There is no evidence of local recurrence or residual disease noted both axillae are benign   LAB RESULTS:  CMP  No results found for: NA, K, CL, CO2, GLUCOSE, BUN, CREATININE, CALCIUM, PROT, ALBUMIN, AST, ALT, ALKPHOS, BILITOT, GFRNONAA, GFRAA  No results found  for: TOTALPROTELP, ALBUMINELP, A1GS, A2GS, BETS, BETA2SER, GAMS, MSPIKE, SPEI  No results found for: KPAFRELGTCHN, LAMBDASER, KAPLAMBRATIO  Lab Results  Component Value Date   WBC 6.4 04/25/2017   HGB 11.2 (L) 04/25/2017   HCT 35.5 (L) 04/25/2017   MCV 81.2 04/25/2017   PLT 303 04/25/2017      Chemistry   No results found for: NA, K, CL, CO2, BUN, CREATININE, GLU No results found for: CALCIUM, ALKPHOS, AST, ALT, BILITOT     No results found for: LABCA2  No components found for: [id number]  No results for input(s): INR in the last 168 hours.  No results found for: LABCA2  No results found for: HCW237  No results found for: SEG315  No results found for: VVO160  No results found for: CA2729  No components found for: HGQUANT  No results found for: CEA1 / No results found for: CEA1   No results found for: AFPTUMOR  No results found for: CHROMOGRNA  No results found for: PSA1  No visits with results within 3 Day(s) from this visit.  Latest known visit with results is:  Hospital Outpatient Visit on 04/25/2017  Component Date Value Ref Range Status  . WBC 04/25/2017 6.4  4.0 - 10.5 K/uL Final  . RBC 04/25/2017 4.37  3.87 - 5.11 MIL/uL Final  . Hemoglobin 04/25/2017 11.2* 12.0 - 15.0 g/dL Final  . HCT 04/25/2017 35.5* 36.0 - 46.0 % Final  . MCV 04/25/2017 81.2  78.0 - 100.0 fL Final  . MCH 04/25/2017 25.6* 26.0 - 34.0 pg Final  . MCHC 04/25/2017 31.5  30.0 - 36.0 g/dL Final  . RDW 04/25/2017 16.8* 11.5 - 15.5 % Final  . Platelets 04/25/2017 303  150 - 400 K/uL Final  . Preg, Serum 04/25/2017 NEGATIVE  NEGATIVE Final   Comment:        THE SENSITIVITY OF THIS METHODOLOGY IS >10 mIU/mL.     (this displays the last labs from the last 3 days)  No results found for: TOTALPROTELP, ALBUMINELP, A1GS, A2GS, BETS, BETA2SER, GAMS, MSPIKE, SPEI (this displays SPEP labs)  No results found for: KPAFRELGTCHN, LAMBDASER, KAPLAMBRATIO (kappa/lambda light chains)  No results  found for: HGBA, HGBA2QUANT, HGBFQUANT, HGBSQUAN (Hemoglobinopathy evaluation)   No results found for: LDH  No results found for: IRON, TIBC, IRONPCTSAT (Iron and TIBC)  No results found for: FERRITIN  Urinalysis No results found for: COLORURINE, APPEARANCEUR, LABSPEC, PHURINE, GLUCOSEU, HGBUR, BILIRUBINUR, KETONESUR, PROTEINUR, UROBILINOGEN, NITRITE, LEUKOCYTESUR   STUDIES: Outside studies reviewed with the patient and past copies given to her  ELIGIBLE FOR AVAILABLE RESEARCH PROTOCOL: no  ASSESSMENT: 46 y.o. Spanish speaker from [place], [place] s/p left breast upper inner quadrant biopsy March 02, 2017 showing atypical ductal hyperplasia  (1) left lumpectomy April 27, 2017 showed only fibrocystic changes, no evidence of malignancy.  (2) tamoxifen started June 13, 2017   PLAN: We spent the better part of today's 50-minute appointment discussing the biology of breast cancer in general, and the specifics of atypical ductal hyperplasia [ADH] more specifically. Dominga understands ADH means, first, relatively unrestricted growth of breast cells and, in addition, some morphologically different features from simple hyperplasia. Atypical ductal hyperplasia can be difficult to tell apart ductal cancer in situ. For those reasons the ADH lesionneeds to be removed.  We emphasized the fact that she does not have breast cancer at this point.  ADH is also a marker of breast cancer risk. The risk of developing breast cancer in patients with ADH falls somewhere between one half and 1% per year. The new cancer can develop in either breast. One half of those tumors would be invasive.  Working from the patient's age to the average survival of women today in the [place] I think Cheryll can expect to live an additional 25-30 years. Using the high end of the risk spectrum for purposes of discussion, this would give her a 20-30% chance of developing breast cancer in her lifetime. This is approximately 3  times the normal risk.  Annabelle has essentially two ways of lowering that risk. One of them is bilateral mastectomies. This is not recommended for this indication and I do not believe it would be covered by insurance even if she wanted it which she doesn't. The other, more reasonable approach would be to take anti-estrogens. This could be tamoxifen or raloxifene/Evista.  She is still premenopausal and so not a candidate for aromatase inhibitors  We then discussed the possible toxicities, side effects and complications of the tamoxifen group. All information was given to Brice in writing. Any of those agents if taken  for 5 years would essentially cut the risk of developing a new breast cancer in half.  After this discussion she was very motivated to start tamoxifen and I went ahead and wrote her the prescription.  She understands tamoxifen is not a contraceptive and she will need to be on some form of contraception while taking this medication.  She will need yearly mammography, preferably with tomography.  This is being arranged through the health department and [facility].  Unfortunately I do not have the name of the person she has been working with there.  I gave the patient a copy of my card so she can give that information to her health department contact so we can receive the records and they can receive hours.  Yanelli will see me again in 3 months.  If she is tolerating tamoxifen well she will start seeing me on a once a year basis until she completes her 5 years of follow-up.  She has a good understanding of the overall plan. She agrees with it. She knows the goal of treatment in her case is prevention. She will call with any problems that may develop before her next visit here.  Kathryn Cruel, MD   06/13/2017 5:57 PM Medical Oncology and Hematology [facility] [address] [place], [place] [address] Tel. [phone]    Fax. [phone]

## 2017-09-12 ENCOUNTER — Inpatient Hospital Stay: Payer: Self-pay | Attending: Oncology | Admitting: Oncology

## 2017-09-12 ENCOUNTER — Telehealth: Payer: Self-pay | Admitting: Oncology

## 2017-09-12 VITALS — BP 115/47 | HR 69 | Temp 98.6°F | Resp 18 | Ht 65.0 in | Wt 140.2 lb

## 2017-09-12 DIAGNOSIS — N6092 Unspecified benign mammary dysplasia of left breast: Secondary | ICD-10-CM | POA: Insufficient documentation

## 2017-09-12 DIAGNOSIS — Z79810 Long term (current) use of selective estrogen receptor modulators (SERMs): Secondary | ICD-10-CM | POA: Insufficient documentation

## 2017-09-12 NOTE — Progress Notes (Signed)
[facility]  Telephone:(336) [phone] Fax:(336) [phone]     ID: Kathryn Cannon DOB: 05/14/1971  MR#: [id number]  WUX#:[id number]#:[id number]  Patient Care Team: Catalina Antiguaonstant, Peggy, MD as PCP - General (Obstetrics and Gynecology) Magrinat, Valentino HueGustav C, MD as Consulting Physician (Oncology) Griselda Mineroth, Paul III, MD as Consulting Physician (General Surgery) Roxy MannsArielle J Pollard OTHER MD:  CHIEF COMPLAINT: Atypical ductal hyperplasia  CURRENT TREATMENT:  tamoxifen   HISTORY OF CURRENT ILLNESS: From the original intake note:  The patient had screening mammography Dec 28, 2016 showing a possible change in the right breast.  On 01/12/2017 she had right diagnostic mammography with tomography and [facility] [place].  This showed the breast density to be category C.  In the upper inner right breast there was an area of persistent distortion.  However there was no ultrasound correlate.  The right axilla was sonographically benign.   On 01/27/2017 the patient attempted right breast biopsy under tomo guidance but the abnormality could not be located so she proceeded to bilateral breast MRI February 14, 2017.  In the right breast there was no MRI correlate for the possible area of architectural distortion.  In the left breast however there was an oval circumscribed mass in the upper inner quadrant measuring 0.7 cm.  There was a second focal area of non-mass-like enhancement in the upper outer quadrant of the left breast measuring 0.8 cm.  There were no abnormal-appearing lymph nodes.  Biopsy of the 2 left breast areas in question was performed March 02, 2017 and showed ([id number]) fibrocystic change in the more outer biopsy and atypical ductal hyperplasia in the more inner located biopsy.  The patient then proceeded to left lumpectomy on April 27, 2017.  This showed ([id number]) fibrocystic changes, but no evidence of malignancy.  She was referred to the high risk  clinic for further evaluation  The patient's subsequent history is as detailed below.  INTERVAL HISTORY: Kathryn Cannon returns today for follow up and treatment of her atypical ductal hyperplasia accompanied by her husband. She continues on tamoxifen, with good tolerance. She has occasional hot flashes. She denies an increase in vaginal discharge.     REVIEW OF SYSTEMS: Kathryn Cannon reports that she feels hungry and thirsty quite often. She notes that she experiences reflux after she eats. She notes that she has pain in her right lower quadrant of her abdomen, but this has improved since we last saw her. She notes that she massages the area when she has pain.  She attributes all the symptoms to tamoxifen.  She reports some mild discomfort in her left breast. She also notes that she hasn't had a period since 07/23/2017. She is using the barrier method for contraception. She notes that she has some occasional headaches but they are occurring less often. She notes some pain in the back of both of her legs, which she aid with massage. She notes that she likes to walk, but she hasn't been walking much recently. She denies unusual headaches, visual changes, nausea, vomiting, or dizziness. There has been no unusual cough, phlegm production, or pleurisy. This been no change in bowel or bladder habits. She denies unexplained fatigue or unexplained weight loss, bleeding, rash, or fever. A detailed review of systems was otherwise stable.    PAST MEDICAL HISTORY: Past Medical History:  Diagnosis Date  . Atypical ductal hyperplasia of left breast   . Headache     PAST SURGICAL HISTORY: Past Surgical History:  Procedure Laterality  Date  . BREAST LUMPECTOMY WITH RADIOACTIVE SEED LOCALIZATION Left 04/27/2017   Procedure: RADIOCATIVE SEED GUIDED LEFT BREAST LUMPECTOMY, ERAS PATHWAY;  Surgeon: Griselda Miner, MD;  [facility];  Service: General;  Laterality: Left;  . CESAREAN SECTION      FAMILY HISTORY Family  History  Problem Relation Age of Onset  . Diabetes Paternal Aunt   As of October 2018 the patient's father is 26 years old and her mother 1 years old.  The patient has 6 brothers and 3 sisters.  There is no history of cancer in the family or the extended family to the best of her knowledge  GYNECOLOGIC HISTORY:  No LMP recorded. Menarche age 61, first live birth age 14, she is GX P3.  She is still having regular periods, which usually last 3-4 days, 1 of which is heavy  SOCIAL HISTORY:  She is originally from [place] city as is her husband.  They have been in this area more than 20 years.  She is a housewife.  There are 3 sons are 70, living in [place], [place], living in [place], and 45 years old    ADVANCED DIRECTIVES:    HEALTH MAINTENANCE: Social History   Tobacco Use  . Smoking status: Never Smoker  . Smokeless tobacco: Never Used  Substance Use Topics  . Alcohol use: No  . Drug use: No     Colonoscopy:  PAP:  Bone density:   No Known Allergies  Current Outpatient Medications  Medication Sig Dispense Refill  . acetaminophen (TYLENOL) 500 MG tablet Take 1,000 mg by mouth 2 (two) times daily as needed for mild pain or headache.    Marland Kitchen HYDROcodone-acetaminophen (NORCO/VICODIN) 5-325 MG tablet Take 1-2 tablets by mouth every 4 (four) hours as needed for moderate pain or severe pain. 10 tablet 0  . Magnesium Oxide 500 MG CAPS Take 500 mg by mouth daily.     No current facility-administered medications for this visit.     OBJECTIVE: Early middle age Hispanic woman in no acute distress  Vitals:   09/12/17 0854  BP: (!) 115/47  Pulse: 69  Resp: 18  Temp: 98.6 F (37 C)  SpO2: 100%     Body mass index is 23.33 kg/m.   Wt Readings from Last 3 Encounters:  09/12/17 140 lb 3.2 oz (63.6 kg)  06/13/17 141 lb 8 oz (64.2 kg)  04/27/17 137 lb (62.1 kg)      ECOG FS:1 - Symptomatic but completely ambulatory  Sclerae unicteric, EOMs intact Oropharynx clear and moist No  cervical or supraclavicular adenopathy Lungs no rales or rhonchi Heart regular rate and rhythm Abd soft, nontender, positive bowel sounds MSK no focal spinal tenderness, no upper extremity lymphedema Neuro: nonfocal, well oriented, appropriate affect Breasts: The right breast is benign.  The left breast is status post lumpectomy, with no evidence of disease activity, no suspicious findings.  Both axillae are benign.  LAB RESULTS:  CMP  No results found for: NA, K, CL, CO2, GLUCOSE, BUN, CREATININE, CALCIUM, PROT, ALBUMIN, AST, ALT, ALKPHOS, BILITOT, GFRNONAA, GFRAA  No results found for: TOTALPROTELP, ALBUMINELP, A1GS, A2GS, BETS, BETA2SER, GAMS, MSPIKE, SPEI  No results found for: KPAFRELGTCHN, LAMBDASER, KAPLAMBRATIO  Lab Results  Component Value Date   WBC 6.4 04/25/2017   HGB 11.2 (L) 04/25/2017   HCT 35.5 (L) 04/25/2017   MCV 81.2 04/25/2017   PLT 303 04/25/2017      Chemistry   No results found for: NA, K, CL, CO2,  BUN, CREATININE, GLU No results found for: CALCIUM, ALKPHOS, AST, ALT, BILITOT     No results found for: LABCA2  No components found for: [id number]  No results for input(s): INR in the last 168 hours.  No results found for: LABCA2  No results found for: WUJ811  No results found for: BJY782  No results found for: NFA213  No results found for: CA2729  No components found for: HGQUANT  No results found for: CEA1 / No results found for: CEA1   No results found for: AFPTUMOR  No results found for: CHROMOGRNA  No results found for: PSA1  No visits with results within 3 Day(s) from this visit.  Latest known visit with results is:  Hospital Outpatient Visit on 04/25/2017  Component Date Value Ref Range Status  . WBC 04/25/2017 6.4  4.0 - 10.5 K/uL Final  . RBC 04/25/2017 4.37  3.87 - 5.11 MIL/uL Final  . Hemoglobin 04/25/2017 11.2* 12.0 - 15.0 g/dL Final  . HCT 08/65/7846 35.5* 36.0 - 46.0 % Final  . MCV 04/25/2017 81.2  78.0 - 100.0 fL Final    . MCH 04/25/2017 25.6* 26.0 - 34.0 pg Final  . MCHC 04/25/2017 31.5  30.0 - 36.0 g/dL Final  . RDW 96/29/5284 16.8* 11.5 - 15.5 % Final  . Platelets 04/25/2017 303  150 - 400 K/uL Final  . Preg, Serum 04/25/2017 NEGATIVE  NEGATIVE Final   Comment:        THE SENSITIVITY OF THIS METHODOLOGY IS >10 mIU/mL.     (this displays the last labs from the last 3 days)  No results found for: TOTALPROTELP, ALBUMINELP, A1GS, A2GS, BETS, BETA2SER, GAMS, MSPIKE, SPEI (this displays SPEP labs)  No results found for: KPAFRELGTCHN, LAMBDASER, KAPLAMBRATIO (kappa/lambda light chains)  No results found for: HGBA, HGBA2QUANT, HGBFQUANT, HGBSQUAN (Hemoglobinopathy evaluation)   No results found for: LDH  No results found for: IRON, TIBC, IRONPCTSAT (Iron and TIBC)  No results found for: FERRITIN  Urinalysis No results found for: COLORURINE, APPEARANCEUR, LABSPEC, PHURINE, GLUCOSEU, HGBUR, BILIRUBINUR, KETONESUR, PROTEINUR, UROBILINOGEN, NITRITE, LEUKOCYTESUR   STUDIES: She will be due for repeat mammography in May.   ELIGIBLE FOR AVAILABLE RESEARCH PROTOCOL: no  ASSESSMENT: 47 y.o. Spanish speaker from [place], [place] s/p left breast upper inner quadrant biopsy March 02, 2017 showing atypical ductal hyperplasia  (1) left lumpectomy April 27, 2017 showed only fibrocystic changes, no evidence of malignancy.  (2) tamoxifen started June 13, 2017  (a) patient is premenopausal  (b) dose reduced to 10 mg daily as of January 2019 because of side effects   PLAN: Kathryn Cannon is having more side effects than expected from the tamoxifen.  These should improve with time, but sometimes they do not and I think it may be difficult for her to complete 5 years at the current dose.  Recent data suggests that a lower dose of tamoxifen may be helpful in prevention.  I am cutting her dose to 10 mg daily as of now and we will see if this allows her to take the medication with fewer complications.  She is  already scheduled to see her gynecologist in April, with mammography in May.  I am going to see her 6 months later, in October.  If everything is stable at that time I will start seeing her on a yearly basis thereafter.  She knows to call for any problems that may develop before the next visit.   Magrinat, Valentino Hue, MD  09/12/17 9:19 AM Medical Oncology  and Hematology [facility] [address] [place], [place] [address] Tel. [phone]    Fax. [phone]  This document serves as a record of services personally performed by Ruthann Cancer, MD. It was created on his behalf by Merideth Abbey, a trained medical scribe. The creation of this record is based on the scribe's personal observations and the provider's statements to them.   I have reviewed the above documentation for accuracy and completeness, and I agree with the above.

## 2017-09-12 NOTE — Telephone Encounter (Signed)
Gave patient AVS and calendar of upcoming October appointments °

## 2017-11-17 ENCOUNTER — Other Ambulatory Visit: Payer: Self-pay | Admitting: Oncology

## 2018-02-02 ENCOUNTER — Other Ambulatory Visit (HOSPITAL_COMMUNITY): Payer: Self-pay | Admitting: *Deleted

## 2018-02-02 DIAGNOSIS — N644 Mastodynia: Secondary | ICD-10-CM

## 2018-02-13 ENCOUNTER — Other Ambulatory Visit (HOSPITAL_COMMUNITY): Payer: Self-pay | Admitting: *Deleted

## 2018-02-13 DIAGNOSIS — Z09 Encounter for follow-up examination after completed treatment for conditions other than malignant neoplasm: Secondary | ICD-10-CM

## 2018-02-14 ENCOUNTER — Ambulatory Visit
Admission: RE | Admit: 2018-02-14 | Discharge: 2018-02-14 | Disposition: A | Discharge status: Home / Self Care | Payer: No Typology Code available for payment source | Source: Ambulatory Visit | Attending: Obstetrics and Gynecology | Admitting: Obstetrics and Gynecology

## 2018-02-14 ENCOUNTER — Ambulatory Visit (HOSPITAL_COMMUNITY)
Admission: RE | Admit: 2018-02-14 | Discharge: 2018-02-14 | Disposition: A | Discharge status: Home / Self Care | Payer: Self-pay | Source: Ambulatory Visit | Attending: Obstetrics and Gynecology | Admitting: Obstetrics and Gynecology

## 2018-02-14 ENCOUNTER — Encounter (HOSPITAL_COMMUNITY): Payer: Self-pay

## 2018-02-14 VITALS — BP 110/60 | Wt 139.4 lb

## 2018-02-14 DIAGNOSIS — N644 Mastodynia: Secondary | ICD-10-CM

## 2018-02-14 DIAGNOSIS — Z1239 Encounter for other screening for malignant neoplasm of breast: Secondary | ICD-10-CM

## 2018-02-14 NOTE — Patient Instructions (Addendum)
Explained breast self awareness with Kathryn Cannon. Patient did not need a Pap smear today due to last Pap smear was in April 2018 per patient. Let her know [facility] will cover Pap smears every 3 years unless has a history of abnormal Pap smears. [facility]. Appointment scheduled for Tuesday, February 14, 2018 at 1250.  Kathryn Cannon verbalized understanding.  Daire Okimoto, Kathaleen Maserhristine Poll, RN 10:49 AM

## 2018-02-14 NOTE — Progress Notes (Signed)
Complaints of left breast pain since September 2018 that has increased over the past three months. Patient states the pain comes and goes. Patient rates the pain at a 9 out of 10.  Pap Smear: Pap smear not completed today. [facility] and normal per patient. Per patient has no history of an abnormal Pap smear. No Pap smear results are in EPIC.  Physical exam: Breasts Breasts symmetrical. No skin abnormalities bilateral breasts. No nipple retraction bilateral breasts. No nipple discharge bilateral breasts. No lymphadenopathy. No lumps palpated bilateral breasts. Complaints of left breast pain at 12 o'clock above the areola on exam. [facility]. Appointment scheduled for Tuesday, February 14, 2018 at 1250.        Pelvic/Bimanual  No Pap smear completed today since last Pap smear was in April 2018 per patient. Pap smear not indicated per BCCCP guidelines.   Smoking History: Patient has never smoked.  Patient Navigation: Patient education provided. [facility]. Spanish interpreter provided.   Breast and Cervical Cancer Risk Assessment: Patient has no family history of breast cancer, known genetic mutations, or radiation treatment to the chest before age 45. Patient has no history of cervical dysplasia, immunocompromised, or DES exposure in-utero.  Risk Assessment    Risk Scores      02/14/2018   Last edited by: Lynnell DikeHolland, Sabrina H, LPN   5-year risk: 1.2 %   Lifetime risk: 10.4 %          Used Spanish interpreter Natale LayErika McReynolds from [facility].

## 2018-02-15 ENCOUNTER — Encounter (HOSPITAL_COMMUNITY): Payer: Self-pay | Admitting: *Deleted

## 2018-06-11 NOTE — Progress Notes (Signed)
[facility]  Telephone:([phone]) [phone] Fax:(336) [phone]     ID: Kathryn Cannon DOB: Aug 07, 1971  MR#: [id number]  QMV#:[id number]  Patient Care Team: Patient, No Pcp Per as PCP - General (General Practice) Magrinat, Valentino Hue, MD as Consulting Physician (Oncology) Griselda Miner, MD as Consulting Physician (General Surgery) OTHER MD:  CHIEF COMPLAINT: Atypical ductal hyperplasia  CURRENT TREATMENT:  tamoxifen   HISTORY OF CURRENT ILLNESS: From the original intake note:  The patient had screening mammography Dec 28, 2016 showing a possible change in the right breast.  On 01/12/2017 she had right diagnostic mammography with tomography and right breast ultrasonography at [facility] in [place].  This showed the breast density to be category C.  In the upper inner right breast there was an area of persistent distortion.  However there was no ultrasound correlate.  The right axilla was sonographically benign.   On 01/27/2017 the patient attempted right breast biopsy under tomo guidance but the abnormality could not be located so she proceeded to bilateral breast MRI February 14, 2017.  In the right breast there was no MRI correlate for the possible area of architectural distortion.  In the left breast however there was an oval circumscribed mass in the upper inner quadrant measuring 0.7 cm.  There was a second focal area of non-mass-like enhancement in the upper outer quadrant of the left breast measuring 0.8 cm.  There were no abnormal-appearing lymph nodes.  Biopsy of the 2 left breast areas in question was performed March 02, 2017 and showed ([id number]) fibrocystic change in the more outer biopsy and atypical ductal hyperplasia in the more inner located biopsy.  The patient then proceeded to left lumpectomy on April 27, 2017.  This showed ([phone]) fibrocystic changes, but no evidence of malignancy.  She was referred to the high risk clinic for further  evaluation  The patient's subsequent history is as detailed below.  INTERVAL HISTORY: Kathryn Cannon returns today for follow up and treatment of her atypical ductal hyperplasia accompanied by her husband. She continues on tamoxifen, with good tolerance. She denies hot flashes, however, she notes used to have a vaginal discharge. At her last visit the tamoxifen dose was cut in half to see if that would help the patient manage side effects better. She is tolerating the 10 mg Tamoxifen dose well at this time.   She is now having irregular heavy periods.  About every other month or so.   Since her last visit to the office, she had a bilateral diagnostic mammogram with [facility] that showed: Breast density category C. No mammographic evidence for malignancy.     REVIEW OF SYSTEMS: Kathryn Cannon reports that she has been doing well with some pulling sensation to the surgical breast. She also notes that her bone on the left side hurts as well and it is worse in the afternoon. She takes tylenol for her pain when it occurs. She completes housework and cooks as her daily activities. She denies unusual headaches, visual changes, nausea, vomiting, or dizziness. There has been no unusual cough, phlegm production, or pleurisy. This been no change in bowel or bladder habits. She denies unexplained fatigue or unexplained weight loss, bleeding, rash, or fever. A detailed review of systems was otherwise stable.     PAST MEDICAL HISTORY: Past Medical History:  Diagnosis Date  . Atypical ductal hyperplasia of left breast   . Headache     PAST SURGICAL HISTORY: Past Surgical  History:  Procedure Laterality Date  . BREAST EXCISIONAL BIOPSY    . BREAST LUMPECTOMY WITH RADIOACTIVE SEED LOCALIZATION Left 04/27/2017   Procedure: RADIOCATIVE SEED GUIDED LEFT BREAST LUMPECTOMY, ERAS PATHWAY;  Surgeon: Griselda Miner, MD;  Location: [facility] OR;  Service: General;  Laterality: Left;  .  BREAST SURGERY    . CESAREAN SECTION      FAMILY HISTORY Family History  Problem Relation Age of Onset  . Diabetes Paternal Aunt   As of October 2018 the patient's father is 77 years old and her mother 41 years old.  The patient has 6 brothers and 3 sisters.  There is no history of cancer in the family or the extended family to the best of her knowledge  GYNECOLOGIC HISTORY:  No LMP recorded. Menarche age 62, first live birth age 43, she is GX P3.  She is still having regular periods, which usually last 3-4 days, 1 of which is heavy  SOCIAL HISTORY: (Updated October 2019) She is originally from [place] as is her husband. They have been in this area more than 20 years.  She is a housewife.  Her husband is a Curator. Their 3 sons are 67 and he works for [employer] in [place], her 44 year old studies Tourism and lives in [place], and her 45 year old is at home. They have a 42 year old grandson that lives at home as well.       ADVANCED DIRECTIVES:    HEALTH MAINTENANCE: Social History   Tobacco Use  . Smoking status: Never Smoker  . Smokeless tobacco: Never Used  Substance Use Topics  . Alcohol use: No  . Drug use: No     Colonoscopy:  PAP:  Bone density:   No Known Allergies  Current Outpatient Medications  Medication Sig Dispense Refill  . acetaminophen (TYLENOL) 500 MG tablet Take 1,000 mg by mouth 2 (two) times daily as needed for mild pain or headache.    . ibuprofen (ADVIL,MOTRIN) 200 MG tablet Take 200 mg by mouth every 6 (six) hours as needed.    . Magnesium Oxide 500 MG CAPS Take 500 mg by mouth daily.    . tamoxifen (NOLVADEX) 10 MG tablet Take 1 tablet (10 mg total) by mouth 2 (two) times daily. 90 tablet 4   No current facility-administered medications for this visit.     OBJECTIVE: Early middle age Hispanic woman who appears well  Vitals:   06/12/18 0840  BP: (!) 120/59  Pulse: (!) 55  Resp: 18  Temp: 98.6 F (37 C)  SpO2: 100%     Body  mass index is 23.21 kg/m.   Wt Readings from Last 3 Encounters:  06/12/18 139 lb 8 oz (63.3 kg)  02/14/18 139 lb 6.4 oz (63.2 kg)  09/12/17 140 lb 3.2 oz (63.6 kg)      ECOG FS:0 - Asymptomatic  Sclerae unicteric, pupils round and equal Oropharynx clear and moist No cervical or supraclavicular adenopathy Lungs no rales or rhonchi Heart regular rate and rhythm Abd soft, nontender, positive bowel sounds MSK no focal spinal tenderness, no upper extremity lymphedema Neuro: nonfocal, well oriented, appropriate affect Breasts: Right breast is unremarkable.  The left breast is status post lumpectomy.  The cosmetic result is excellent.  There is no palpable mass and no skin or nipple change of concern.  Both axillae are benign LAB RESULTS:  CMP  No results found for: NA, K, CL, CO2, GLUCOSE, BUN, CREATININE, CALCIUM, PROT, ALBUMIN,  AST, ALT, ALKPHOS, BILITOT, GFRNONAA, GFRAA  No results found for: TOTALPROTELP, ALBUMINELP, A1GS, A2GS, BETS, BETA2SER, GAMS, MSPIKE, SPEI  No results found for: KPAFRELGTCHN, LAMBDASER, KAPLAMBRATIO  Lab Results  Component Value Date   WBC 6.4 04/25/2017   HGB 11.2 (L) 04/25/2017   HCT 35.5 (L) 04/25/2017   MCV 81.2 04/25/2017   PLT 303 04/25/2017      Chemistry   No results found for: NA, K, CL, CO2, BUN, CREATININE, GLU No results found for: CALCIUM, ALKPHOS, AST, ALT, BILITOT     No results found for: LABCA2  No components found for: ZOXWRU045  No results for input(s): INR in the last 168 hours.  No results found for: LABCA2  No results found for: WUJ811  No results found for: BJY782  No results found for: NFA213  No results found for: CA2729  No components found for: HGQUANT  No results found for: CEA1 / No results found for: CEA1   No results found for: AFPTUMOR  No results found for: CHROMOGRNA  No results found for: PSA1  No visits with results within 3 Day(s) from this visit.  Latest known visit with results is:   Hospital Outpatient Visit on 04/25/2017  Component Date Value Ref Range Status  . WBC 04/25/2017 6.4  4.0 - 10.5 K/uL Final  . RBC 04/25/2017 4.37  3.87 - 5.11 MIL/uL Final  . Hemoglobin 04/25/2017 11.2* 12.0 - 15.0 g/dL Final  . HCT 08/65/7846 35.5* 36.0 - 46.0 % Final  . MCV 04/25/2017 81.2  78.0 - 100.0 fL Final  . MCH 04/25/2017 25.6* 26.0 - 34.0 pg Final  . MCHC 04/25/2017 31.5  30.0 - 36.0 g/dL Final  . RDW 96/29/5284 16.8* 11.5 - 15.5 % Final  . Platelets 04/25/2017 303  150 - 400 K/uL Final  . Preg, Serum 04/25/2017 NEGATIVE  NEGATIVE Final   Comment:        THE SENSITIVITY OF THIS METHODOLOGY IS >10 mIU/mL.     (this displays the last labs from the last 3 days)  No results found for: TOTALPROTELP, ALBUMINELP, A1GS, A2GS, BETS, BETA2SER, GAMS, MSPIKE, SPEI (this displays SPEP labs)  No results found for: KPAFRELGTCHN, LAMBDASER, KAPLAMBRATIO (kappa/lambda light chains)  No results found for: HGBA, HGBA2QUANT, HGBFQUANT, HGBSQUAN (Hemoglobinopathy evaluation)   No results found for: LDH  No results found for: IRON, TIBC, IRONPCTSAT (Iron and TIBC)  No results found for: FERRITIN  Urinalysis No results found for: COLORURINE, APPEARANCEUR, LABSPEC, PHURINE, GLUCOSEU, HGBUR, BILIRUBINUR, KETONESUR, PROTEINUR, UROBILINOGEN, NITRITE, LEUKOCYTESUR   STUDIES: Bilateral diagnostic [facility] showed: Breast density category C. No mammographic evidence for malignancy.    ELIGIBLE FOR AVAILABLE RESEARCH PROTOCOL: no  ASSESSMENT: 47 y.o. Spanish speaker from [place], [place] s/p left breast upper inner quadrant biopsy March 02, 2017 showing atypical ductal hyperplasia  (1) left lumpectomy April 27, 2017 showed only fibrocystic changes, no evidence of malignancy.  (2) tamoxifen started June 13, 2017  (a) patient is premenopausal  (b) dose reduced to 10 mg daily as of January 2019 because of side  effects   PLAN: Kathryn Cannon is now a year into her 5 planned years of tamoxifen, and she is tolerating the drug well.  We are doing 10 mg a day, which is what she was able to tolerate without difficulty.  Today we again discussed the fact that tamoxifen is not a contraceptive.  They are using barrier methods for contraception.  She asked about alcohol.  Any amount of alcohol will increase the risk of cancer to a small extent.  We no longer think that taking alcohol even in small measures is good for the heart.  She is interested in having a margarita perhaps once a month and I think that certainly is acceptable from the point of view of quality of life.  She will see me again in 1 year after her next mammogram.  She knows to call for any other issues that may develop before that visit.  Magrinat, Valentino Hue, MD  06/12/18 9:00 AM Medical Oncology and Hematology [facility] [address] [place], [place] [address] Tel. [phone]    Fax. [phone]    I, Soijett Blue am acting as scribe for Dr. Raymond Gurney C. Magrinat.  I, Ruthann Cancer MD, have reviewed the above documentation for accuracy and completeness, and I agree with the above.

## 2018-06-12 ENCOUNTER — Inpatient Hospital Stay: Payer: Self-pay | Attending: Oncology | Admitting: Oncology

## 2018-06-12 ENCOUNTER — Telehealth: Payer: Self-pay | Admitting: Oncology

## 2018-06-12 VITALS — BP 120/59 | HR 55 | Temp 98.6°F | Resp 18 | Ht 65.0 in | Wt 139.5 lb

## 2018-06-12 DIAGNOSIS — N6092 Unspecified benign mammary dysplasia of left breast: Secondary | ICD-10-CM | POA: Insufficient documentation

## 2018-06-12 DIAGNOSIS — Z79899 Other long term (current) drug therapy: Secondary | ICD-10-CM | POA: Insufficient documentation

## 2018-06-12 DIAGNOSIS — N921 Excessive and frequent menstruation with irregular cycle: Secondary | ICD-10-CM | POA: Insufficient documentation

## 2018-06-12 DIAGNOSIS — Z79810 Long term (current) use of selective estrogen receptor modulators (SERMs): Secondary | ICD-10-CM | POA: Insufficient documentation

## 2018-06-12 DIAGNOSIS — N898 Other specified noninflammatory disorders of vagina: Secondary | ICD-10-CM | POA: Insufficient documentation

## 2018-06-12 MED ORDER — TAMOXIFEN CITRATE 10 MG PO TABS: 10.0000 mg | ORAL_TABLET | Freq: Two times a day (BID) | ORAL | 4 refills | Status: DC

## 2018-06-12 NOTE — Telephone Encounter (Signed)
Gave avs calendar would not print °

## 2019-02-21 ENCOUNTER — Other Ambulatory Visit (HOSPITAL_COMMUNITY): Payer: Self-pay | Admitting: *Deleted

## 2019-02-21 DIAGNOSIS — Z1231 Encounter for screening mammogram for malignant neoplasm of breast: Secondary | ICD-10-CM

## 2019-02-26 ENCOUNTER — Other Ambulatory Visit (HOSPITAL_COMMUNITY): Payer: Self-pay | Admitting: *Deleted

## 2019-02-26 DIAGNOSIS — Z1231 Encounter for screening mammogram for malignant neoplasm of breast: Secondary | ICD-10-CM

## 2019-03-01 ENCOUNTER — Ambulatory Visit (HOSPITAL_COMMUNITY)
Admission: RE | Admit: 2019-03-01 | Discharge: 2019-03-01 | Disposition: A | Discharge status: Home / Self Care | Payer: No Typology Code available for payment source | Source: Ambulatory Visit | Attending: Obstetrics and Gynecology | Admitting: Obstetrics and Gynecology

## 2019-03-01 ENCOUNTER — Ambulatory Visit
Admission: RE | Admit: 2019-03-01 | Discharge: 2019-03-01 | Disposition: A | Discharge status: Home / Self Care | Payer: No Typology Code available for payment source | Source: Ambulatory Visit | Attending: Obstetrics and Gynecology | Admitting: Obstetrics and Gynecology

## 2019-03-01 ENCOUNTER — Other Ambulatory Visit: Payer: Self-pay

## 2019-03-01 ENCOUNTER — Encounter (HOSPITAL_COMMUNITY): Payer: Self-pay

## 2019-03-01 DIAGNOSIS — Z1239 Encounter for other screening for malignant neoplasm of breast: Secondary | ICD-10-CM | POA: Insufficient documentation

## 2019-03-01 DIAGNOSIS — Z1231 Encounter for screening mammogram for malignant neoplasm of breast: Secondary | ICD-10-CM

## 2019-03-01 HISTORY — DX: Hyperlipidemia, unspecified: E78.5

## 2019-03-01 NOTE — Progress Notes (Addendum)
No complaints today.   Pap Smear: Pap smear not completed today. Last Pap smear was in April 2018 at [facility] and normal per patient. Per patient has no history of an abnormal Pap smear. No Pap smear results are in EPIC.  Physical exam: Breasts Breasts symmetrical. No skin abnormalities bilateral breasts. No nipple retraction bilateral breasts. No nipple discharge bilateral breasts. No lymphadenopathy. No lumps palpated bilateral breasts. No complaints of pain or tenderness on exam. Referred patient to the [place] for a screening mammogram. Appointment scheduled for Thursday, March 01, 2019 at 1320.        Pelvic/Bimanual No Pap smear completed today since last Pap smear wasin April 2018 per patient. Pap smear not indicated per BCCCP guidelines.  Smoking History: Patient has never smoked.  Patient Navigation: Patient education provided. Access to services provided for patient through [facility] program. Spanish interpreter provided.   Breast and Cervical Cancer Risk Assessment: Patient has no family history of breast cancer. Patient has history of atypical hyperplasia of her left breast. Patient has no known genetic mutations or history of radiation treatment to the chest before age 16. Patient has no history of cervical dysplasia, immunocompromised, or DES exposure in-utero.  Risk Assessment    Risk Scores      03/01/2019 02/14/2018   Last edited by: Armond Hang, LPN Rolena Infante H, LPN   5-year risk: 1.1 % 1.2 %   Lifetime risk: 10.2 % 10.4 %         Used Spanish interpreter Rudene Anda from [place].

## 2019-03-01 NOTE — Addendum Note (Signed)
Encounter addended by: Loletta Parish, RN on: 03/01/2019 12:09 PM  Actions taken: Clinical Note Signed

## 2019-03-01 NOTE — Patient Instructions (Signed)
Explained breast self awareness with Kathryn Cannon. Patient did not need a Pap smear today due to last Pap smear was in April 2018 per patient. Let her know BCCCP will cover Pap smears every 3 years unless has a history of abnormal Pap smears. Referred patient to the [facility] for a screening mammogram. Appointment scheduled for Thursday, March 01, 2019 at 1320. Patient aware of appointment and will be there. Let patient know the [facility] will follow up with her within the next couple weeks with results of mammogram by letter or phone. Kathryn Cannon verbalized understanding.  Montreal Steidle, Arvil Chaco, RN 12:02 PM

## 2019-03-08 ENCOUNTER — Encounter (HOSPITAL_COMMUNITY): Payer: Self-pay | Admitting: *Deleted

## 2019-03-12 ENCOUNTER — Inpatient Hospital Stay: Payer: Self-pay | Attending: Obstetrics and Gynecology | Admitting: *Deleted

## 2019-03-12 ENCOUNTER — Other Ambulatory Visit: Payer: Self-pay

## 2019-03-12 VITALS — BP 154/100 | Temp 97.3°F | Ht 61.75 in | Wt 141.0 lb

## 2019-03-12 DIAGNOSIS — Z0000 Encounter for general adult medical examination without abnormal findings: Secondary | ICD-10-CM

## 2019-03-12 NOTE — Progress Notes (Signed)
Wisewoman initial screening   Interpreter- Rudene Anda, [employer]   Clinical Measurement:  Height: 61.75 in Weight: 141 lb  Blood Pressure: 160/100  Blood Pressure #2: 154/100 Fasting Labs Drawn Today, will review with patient when they result.   Medical History: Patient states that she has high cholesterol and high blood pressure. Patient does not have diabetes.    Medications: Patient states that she takes medication to lower cholesterol. Patient does not take any medication to lower blood pressure or blood sugar. Patient does not take an aspirin a day to help prevent a heart attack or stroke. During the past 7 days patient has taken prescribed medication to lower cholesterol on all 7 days.   Blood pressure, self measurement:  Patient states that she does not measure blood pressure from home. Patient does not have equipment to measure blood pressure at home.   Nutrition: Patient states that on average she eats 4 cups of fruit and 3 cups of vegetables per day. Patient states that she does not eat fish at least 2 times per week. Patient eats less than half servings of whole grains. Patient drinks less than 36 ounces of beverages with added sugar weekly. Patient is currently watching sodium or salt intake. In the past 7 days patient has not had any drinks containing alcohol. On average patient does not drink any drinks containing alcohol.      Physical activity:  Patient states that she gets 0 minutes of moderate and 0 minutes of vigorous physical activity each week.  Smoking status:  Patient states that she has never smoked tobacco.   Quality of life:  Over the past 2 weeks patient states that she has not had any days where she has little interest or pleasure in doing things and 0 days where she has felt down, depressed or hopeless.    Risk reduction and counseling:  Patient was told about heart wise program. Patient agreed to participate in Heart Wise program. Gave consent to patient to read and  sign, went over daily log sheets and educated patient on how to take blood pressure at home using monitor. Patient asked questions and voiced understanding. Told patient to contact me with any questions.   Health Coaching: Encouraged patient to try and add heart healthy fish into diet. Also encouraged patient to add whole grains into diet, like brown rice, oatmeal, wheat bread and whole wheat pasta.    Navigation:  I will notify patient of lab results. Patient is aware of 2 more health coaching sessions and a follow up. Informed patient that I will be referring her for follow-up for her elevated blood pressure.  Time: 35 minutes

## 2019-03-13 LAB — LIPID PANEL W/O CHOL/HDL RATIO
Cholesterol, Total: 254 mg/dL — ABNORMAL HIGH (ref 100–199)
HDL: 31 mg/dL — ABNORMAL LOW (ref >39)
Triglycerides: 752 mg/dL (ref 0–149)

## 2019-03-13 LAB — HGB A1C W/O EAG: Hgb A1c MFr Bld: 5.9 % — ABNORMAL HIGH (ref 4.8–5.6)

## 2019-03-13 LAB — GLUCOSE, RANDOM: Glucose: 112 mg/dL — ABNORMAL HIGH (ref 65–99)

## 2019-03-14 ENCOUNTER — Telehealth: Payer: Self-pay

## 2019-03-16 NOTE — Telephone Encounter (Signed)
Health coaching 2   Interpreter- Rudene Anda, [facility]   Labs- 254 cholesterol, (triglyceride result indicated is too high for an accurate LDL) LDL cholesterol , 752 triglycerides, 31 HDL cholesterol, 5.9 hemoglobin A1C, 112 mean plasma glucose   Patient understands and is aware of her lab results.   Goals-  Spoke with patient about lab results. Answered any questions patient had about results. Informed patient that since she had elevated blood pressure during initial visit and since her labs came back elevated I would be referring her to [facility] for follow-up. Patient voiced concern over elevated labs. Spent time educating patient about some things she can do with her diet and exercise to help lower blood pressure and cholesterol/glucose. Informed her that the doctor she sees at Internal Medicine will also speak with her about any suggestions they have about diet and exercise/ medications. Goals for patient to work on during the next few weeks are to watch the amount of sugar and carbs that she consumes. Also encouraged patient to add heart healthy fish into her diet at least 2 times per week. Spoke with patient about avoiding fried/ fatty foods and adding healthy fats in diet. Also encouraged patient to get at least 20-30 minutes of exercise daily by walking. Let patient know that I would check back in with her at the end of next week to get her first week of BP readings for the Heart Wise program. Also told patient to take her BP log with her to her follow-up appointment.   Navigation:  Patient is aware of 1 more health coaching sessions and a follow up. Patient is scheduled for follow-up appointment with [facility] on August 6th @ 10:15 am.  Time-  15 minutes

## 2019-03-22 ENCOUNTER — Ambulatory Visit (INDEPENDENT_AMBULATORY_CARE_PROVIDER_SITE_OTHER): Payer: Self-pay | Admitting: Internal Medicine

## 2019-03-22 ENCOUNTER — Encounter: Payer: Self-pay | Admitting: Internal Medicine

## 2019-03-22 ENCOUNTER — Other Ambulatory Visit: Payer: Self-pay

## 2019-03-22 VITALS — BP 133/81 | HR 61 | Temp 98.1°F | Ht 65.0 in | Wt 141.2 lb

## 2019-03-22 DIAGNOSIS — E785 Hyperlipidemia, unspecified: Secondary | ICD-10-CM | POA: Insufficient documentation

## 2019-03-22 DIAGNOSIS — E781 Pure hyperglyceridemia: Secondary | ICD-10-CM

## 2019-03-22 DIAGNOSIS — R030 Elevated blood-pressure reading, without diagnosis of hypertension: Secondary | ICD-10-CM

## 2019-03-22 DIAGNOSIS — Z7982 Long term (current) use of aspirin: Secondary | ICD-10-CM

## 2019-03-22 DIAGNOSIS — Z79899 Other long term (current) drug therapy: Secondary | ICD-10-CM

## 2019-03-22 MED ORDER — FENOFIBRATE 48 MG PO TABS: 48.0000 mg | ORAL_TABLET | Freq: Every day | ORAL | 3 refills | Status: DC

## 2019-03-22 NOTE — Progress Notes (Signed)
   CC: Establish care, follow-up on hypertriglyceridemia  HPI:  Kathryn Cannon is a 48 y.o. very pleasant Spanish-speaking woman here for evaluation of hyperlipidemia.  2 Spanish interpreters were used Linna Hoff: [id number], Janett Billow: [id number])  Please see problem based charting for further details.  Past Medical History:  Diagnosis Date  . Atypical ductal hyperplasia of left breast   . Headache   . Hyperlipidemia    Review of Systems:  Review of Systems  Constitutional: Negative.   HENT: Negative.   Eyes: Negative.   Respiratory: Negative.   Cardiovascular: Negative.   Gastrointestinal: Negative.   Musculoskeletal: Negative.   Skin: Negative.   Neurological: Negative.   Psychiatric/Behavioral: Negative.    Past medical history: Atypical ductal hyperplasia of left breast status post lumpectomy in 2018 currently on tamoxifen Surgical history: Lumpectomy 2018 Allergies: None Medication: Lipitor, aspirin Family history: Father with diabetes mellitus Occupation: Housekeeper Social history: Loves to exercise, denies EtOH, illicit drug use or cigarette use.  Vitals:   03/22/19 1005  BP: 133/81  Pulse: 61  Temp: 98.1 F (36.7 C)  TempSrc: Oral  SpO2: 100%  Weight: 141 lb 3.2 oz (64 kg)  Height: 5\' 5"  (1.651 m)   Physical Exam Constitutional:      Appearance: She is normal weight.  HENT:     Head: Normocephalic and atraumatic.  Cardiovascular:     Rate and Rhythm: Normal rate.     Heart sounds: Normal heart sounds. No murmur.  Pulmonary:     Effort: Pulmonary effort is normal.     Breath sounds: Normal breath sounds. No wheezing, rhonchi or rales.  Abdominal:     General: Bowel sounds are normal.  Musculoskeletal:        General: No deformity.     Right lower leg: No edema.     Left lower leg: No edema.  Skin:    General: Skin is warm.  Neurological:     Mental Status: She is alert.  Psychiatric:        Mood and Affect: Mood normal.     Assessment &  Plan:   See Encounters Tab for problem based charting.  Patient discussed with Dr. Evette Doffing

## 2019-03-22 NOTE — Patient Instructions (Signed)
*  AVS translated into Spanish with Spanish interpreter*  Kathryn Cannon,   It was a pleasure seeing you here at the clinic today.  Overall, it seems that you are doing very well even though your triglyceride was elevated.  Even though this number is high, you are not at the increased risk of having some type of heart attack or stroke.  Your calculated risk is around 3.3%.  With this information, I would like for you to stop taking the Lipitor.  I would encourage you to decrease the amount of fat diet that you eat.  Also continue to exercise.  For your blood pressure, today it was 133/81 and I would not start you on a new medication.  I will repeat a cholesterol lab in the next couple months while you are fasting.  Take Care! Dr. Eileen Stanford

## 2019-03-22 NOTE — Progress Notes (Signed)
Internal Medicine Clinic Attending  Case discussed with Dr. Agyei at the time of the visit.  We reviewed the resident's history and exam and pertinent patient test results.  I agree with the assessment, diagnosis, and plan of care documented in the resident's note.    

## 2019-03-22 NOTE — Assessment & Plan Note (Signed)
Hyperlipidemia: Kathryn Cannon presents to the clinic today after she was found to have an abnormal lipid panel.  She was noted to have an elevated cholesterol of 254, triglyceride of 752, HDL of 31 when labs were ordered on March 12, 2019.  At that visit, she was started on Lipitor and asked to follow-up with primary care physician.  Today, she is completely asymptomatic and denies chest pain, dizziness, lightheadedness, syncope, headaches.  She does also mention that in March of this year she presented to [facility] with abdominal pain and nausea.  While she was admitted, she states that she was told she had a "inflamed pancreas "and was discharged with an antiemetic.  Today, she denies any abdominal pain, nausea, vomiting.  She denies eating greasy foods and states that she also exercises on a daily basis.  Her calculated ASCVD score is 3.3%.  Though she has had a "suspected "episode of pancreatitis, the ultimate goal for her is lifestyle modifications which includes exercises, limiting greasy foods.  Also, it is unclear if the lipid panel was obtained while she was fasting.  Plan: - We had an extensive discussion about lifestyle modifications including exercise and limiting greasy foods -Advised to stop taking Lipitor - Will obtain lipid panel in 1 to 2 months while fasting -Start fenofibrate - Return precautions PRN

## 2019-03-22 NOTE — Assessment & Plan Note (Signed)
Elevated blood pressure: She had 2 readings of elevated blood pressure on March 12, 2019.  Range BP was 150s-160s/100s.  Since then, she has kept a blood pressure log and her BP range has been 120s-140s/60s-80s.  She remains completely asymptomatic denies any cardiopulmonary problems.  BP Readings from Last 3 Encounters:  03/22/19 133/81  03/12/19 (!) 154/100  03/01/19 (!) 124/96   Plan: -Advised on lifestyle modifications -Will hold off on starting antihypertensive for now

## 2019-04-09 ENCOUNTER — Telehealth: Payer: Self-pay

## 2019-04-09 NOTE — Telephone Encounter (Signed)
Health Coaching 3  interpreter- Rudene Anda, [facility]   Goals- Patient has decreased the amount of red meats that consumes. Patient has also started eating heart healthy fish 2 times a week. Patient has added in brown rice in diet to get whole grains. She has also started cooking with olive oil for a healthy fat. Patient stated that she has started walking 40 minutes a day.   Patient also gave me her BP readings from the past 3 weeks. Patient has done a good job checking and logging her BP. Encouraged patient to keep up the good work.   New goal- Patient is going to start measuring out her fruits and vegetables in the measuring cups that I gave her. She was not sure how much she has been consuming. Encouraged her to use the measuring cups to watch her portion sizes and to make sure that she is getting enough foods from the different food groups.   Barrier to reaching goal- none   Strategies to overcome- NA   Navigation:  Patient is aware of  a follow up session.   Time- 31 minutes

## 2019-04-09 NOTE — Telephone Encounter (Signed)
Note- Patient is scheudled for final [facility] follow-up visit on Wednesday, October 7th @ 10:00 am.

## 2019-04-11 ENCOUNTER — Other Ambulatory Visit: Payer: Self-pay

## 2019-04-11 ENCOUNTER — Ambulatory Visit: Payer: No Typology Code available for payment source

## 2019-05-07 ENCOUNTER — Telehealth: Payer: Self-pay | Admitting: Oncology

## 2019-05-07 ENCOUNTER — Other Ambulatory Visit: Payer: Self-pay | Admitting: Oncology

## 2019-05-07 NOTE — Telephone Encounter (Signed)
[place] 10/28 MOVED FROM GM TO LC PER GM. CONFIRMED WITH PATIENT VIA PACIFIC INTERPRETER Seth Bake [phone].

## 2019-05-09 ENCOUNTER — Ambulatory Visit: Payer: No Typology Code available for payment source

## 2019-05-23 ENCOUNTER — Inpatient Hospital Stay: Payer: No Typology Code available for payment source | Attending: Obstetrics and Gynecology | Admitting: *Deleted

## 2019-05-23 ENCOUNTER — Other Ambulatory Visit: Payer: Self-pay

## 2019-05-23 VITALS — BP 125/78 | Temp 96.9°F | Ht 61.75 in | Wt 136.0 lb

## 2019-05-23 DIAGNOSIS — Z0000 Encounter for general adult medical examination without abnormal findings: Secondary | ICD-10-CM

## 2019-05-23 NOTE — Progress Notes (Signed)
[facility] follow up   Clinical Measurement:  Height: 61.75 in Weight: 136 lb  Blood Pressure: 145/83  Blood Pressure #2: 125/78    Medical History:  Patient states that she has a history of high cholesterol and high blood pressure. Patient states that she does not have a history of diabetes.   Medications:  Patient states that she takes medication to lower cholesterol. Patient does not take any medication to lower blood pressure or blood sugar. Patient does not take an aspirin a day to help prevent a heart attack or stroke. During the past 7 days patient has taken prescribed medication to lower cholesterol on 7 days.  Blood pressure, self measurement:  Patient states that she measures blood pressure from home weekly.Patient shares blood pressure readings with a healthcare provider.    Nutrition:  Patient states that on average she eats 2 cups of fruit and 3 cups of vegetables per day. Patient states that she does not eat fish at least 2 times per week. In a typical day patient eats more than half servings of whole grains. Patient drinks less than 36 ounces of beverages with added sugar weekly. Patient is currently watching sodium or salt intake. In the past 7 days patient has not had any drinks containing alcohol. On average patient does not drink any drinks containing alcohol.       Physical activity:  Patient states that she gets 420 minutes of moderate and 0 minutes of vigorous physical activity each week.  Smoking status:  Patient states that she has never smoked tobacco.   Quality of life:  Over the past 2 weeks patient states that she has days where she has little interest or pleasure in doing things and days where she has felt down, depressed or hopeless.   Health Coaching: Reviewed BP logs that patient completed for [facility]. Encouraged patient to continue checking BP at home weekly. Informed patient that since her 1st BP reading taken today was elevated that I would refer her  back to Internal Medicine for follow-up. Encouraged her to continue with the diet and exercise changes that the patient has made over the past several months. Congratulated patient on her 5 pound weight loss.  Also encouraged patient to continue watching the amount of fried and fatty foods consumed. As well as continuing to eat whole grains and heart healthy fish.   Navigation: This was the  follow up session for this patient, I will check up on her progress in the coming months. Will call patient with follow-up appointment information for Internal Medicine for elevated BP.  Time: 30 minutes

## 2019-05-25 ENCOUNTER — Telehealth: Payer: Self-pay

## 2019-05-25 NOTE — Telephone Encounter (Signed)
Called patient via [place] interpreters [phone]. Spoke with patient to give follow-up appointment information. Patient is scheduled with [facility] on Wed. Oct 14th @ 3:15 pm for elevated BP FU.

## 2019-05-30 ENCOUNTER — Encounter: Payer: Self-pay | Admitting: Internal Medicine

## 2019-05-30 ENCOUNTER — Ambulatory Visit (INDEPENDENT_AMBULATORY_CARE_PROVIDER_SITE_OTHER): Payer: Self-pay | Admitting: Internal Medicine

## 2019-05-30 VITALS — BP 107/42 | HR 49 | Temp 98.2°F | Wt 139.2 lb

## 2019-05-30 DIAGNOSIS — R030 Elevated blood-pressure reading, without diagnosis of hypertension: Secondary | ICD-10-CM

## 2019-05-30 DIAGNOSIS — E785 Hyperlipidemia, unspecified: Secondary | ICD-10-CM

## 2019-05-30 DIAGNOSIS — E781 Pure hyperglyceridemia: Secondary | ICD-10-CM

## 2019-05-30 DIAGNOSIS — Z79899 Other long term (current) drug therapy: Secondary | ICD-10-CM

## 2019-05-30 MED ORDER — FENOFIBRATE 48 MG PO TABS: 48.0000 mg | ORAL_TABLET | Freq: Every day | ORAL | 3 refills | Status: DC

## 2019-05-30 NOTE — Progress Notes (Signed)
   CC: High blood pressure and high cholesterol    HPI:[name] is a 48 y.o. with past medical history listed below.  Please see problem based charting for details presentation, assessment, and plan.  Past Medical History:  Diagnosis Date  . Atypical ductal hyperplasia of left breast   . Headache   . Hyperlipidemia    Review of Systems:  Review of Systems  Constitutional: Negative for chills and fever.  Eyes: Negative for blurred vision and double vision.  Gastrointestinal: Negative for abdominal pain, nausea and vomiting.  Genitourinary: Negative for dysuria and urgency.    Physical Exam:  Vitals:   05/30/19 1530  BP: (!) 107/42  Pulse: (!) 49  Temp: 98.2 F (36.8 C)  TempSrc: Oral  SpO2: 100%  Weight: 139 lb 3.2 oz (63.1 kg)   Physical Exam Constitutional:      Appearance: Normal appearance.  Cardiovascular:     Rate and Rhythm: Normal rate and regular rhythm.  Pulmonary:     Breath sounds: No wheezing, rhonchi or rales.  Abdominal:     General: Bowel sounds are normal.     Tenderness: There is no abdominal tenderness.  Skin:    General: Skin is warm and dry.  Neurological:     Mental Status: She is alert.  Psychiatric:        Mood and Affect: Mood normal.        Behavior: Behavior normal.      Assessment & Plan:   See Encounters Tab for problem based charting.  Patient seen with Dr. Evette Doffing

## 2019-05-30 NOTE — Assessment & Plan Note (Signed)
Patient presented a blood pressure log today. Her BP is normal at home. Also normal in clinic today.  - Will continue to monitor on visits to clinc

## 2019-05-30 NOTE — Assessment & Plan Note (Signed)
Patient seen 04/05/2019 in clinic. Started on fenofibrate with plan for follow-up in 1-2 months for fasting lipid panel. Patient scheduled to come in at 9 am tomorrow morning for lipid panel. Depending on lipid panel results will adjust medications. Patient reports a healthier diet which includes eating more fruits and vegetables.  - Lipid panel - Continue fenofibrate

## 2019-05-30 NOTE — Patient Instructions (Addendum)
  Gracias por confiarme su cuidado. En resumen, hoy discutimos lo siguiente:   Presin arterial - Su presin arterial es normal. No necesita medicamentos para la presin arterial  Colesterol y triglicridos altos - Venga a la clnica por la maana a las 9:00 am. Comprobaremos su panel lipdico. - Contine con su fenofibrato. Envi una recarga a su farmacia.  Mi mejor,  Tamsen Snider, MD

## 2019-05-31 ENCOUNTER — Other Ambulatory Visit (INDEPENDENT_AMBULATORY_CARE_PROVIDER_SITE_OTHER): Payer: Self-pay

## 2019-05-31 DIAGNOSIS — E781 Pure hyperglyceridemia: Secondary | ICD-10-CM

## 2019-05-31 NOTE — Progress Notes (Signed)
Internal Medicine Clinic Attending  I saw and evaluated the patient.  I personally confirmed the key portions of the history and exam documented by Dr. Steen and I reviewed pertinent patient test results.  The assessment, diagnosis, and plan were formulated together and I agree with the documentation in the resident's note.     

## 2019-06-01 ENCOUNTER — Telehealth: Payer: Self-pay | Admitting: Internal Medicine

## 2019-06-01 LAB — LIPID PANEL
Chol/HDL Ratio: 4.5 ratio — ABNORMAL HIGH (ref 0.0–4.4)
Cholesterol, Total: 188 mg/dL (ref 100–199)
HDL: 42 mg/dL (ref >39)
LDL Chol Calc (NIH): 109 mg/dL — ABNORMAL HIGH (ref 0–99)
Triglycerides: 214 mg/dL — ABNORMAL HIGH (ref 0–149)
VLDL Cholesterol Cal: 37 mg/dL (ref 5–40)

## 2019-06-13 ENCOUNTER — Inpatient Hospital Stay: Payer: No Typology Code available for payment source | Admitting: Adult Health

## 2019-06-13 NOTE — Progress Notes (Deleted)
[facility]  Telephone:([phone]) [phone] [phone]     ID: Kathryn Cannon DOB: 03/13/1971  MR#: [id number]  QIO#:[id number]#:[id number]  Patient Care Team: Albertha GheeSteen, James, MD as PCP - General (Internal Medicine) Magrinat, Valentino HueGustav C, MD as Consulting Physician (Oncology) Griselda Mineroth, Paul III, MD as Consulting Physician (General Surgery) OTHER MD:  CHIEF COMPLAINT: Atypical ductal hyperplasia  CURRENT TREATMENT:  tamoxifen   HISTORY OF CURRENT ILLNESS: From the original intake note:  The patient had screening mammography Dec 28, 2016 showing a possible change in the right breast.  On 01/12/2017 she had right diagnostic mammography with tomography and right breast ultrason[facility] at [facility] [place].  This showed the breast density to be category C.  In the upper inner right breast there was an area of persistent distortion.  However there was no ultrasound correlate.  The right axilla was sonographically benign.   On 01/27/2017 the patient attempted right breast biopsy under tomo guidance but the abnormality could not be located so she proceeded to bilateral breast MRI February 14, 2017.  In the right breast there was no MRI correlate for the possible area of architectural distortion.  In the left breast however there was an oval circumscribed mass in the upper inner quadrant measuring 0.7 cm.  There was a second focal area of non-mass-like enhancement in the upper outer quadrant of the left breast measuring 0.8 cm.  There were no abnormal-appearing lymph nodes.  Biopsy of the 2 left breast areas in question was performed March 02, 2017 and showed ([id number]) fibrocystic change in the more outer biopsy and atypical ductal hyperplasia in the more inner located biopsy.  The patient then proceeded to left lumpectomy on April 27, 2017.  This showed ([id number]) fibrocystic changes, but no evidence of malignancy.  She was referred to the high risk clinic for further evaluation  The patient's subsequent history is as detailed below.  INTERVAL HISTORY: [name] returns today for follow up and treatment of her atypical ductal hyperplasia accompanied by her husband. She continues on tamoxifen, with good tolerance.    REVIEW OF SYSTEMS: Shandora r    PAST MEDICAL HISTORY: Past Medical History:  Diagnosis Date  . Atypical ductal hyperplasia of left breast   . Headache   . Hyperlipidemia     PAST SURGICAL HISTORY: Past Surgical History:  Procedure Laterality Date  . BREAST EXCISIONAL BIOPSY    . BREAST LUMPECTOMY WITH RADIOACTIVE SEED LOCALIZATION Left 04/27/2017   Procedure: RADIOCATIVE SEED GUIDED LEFT BREAST LUMPECTOMY, ERAS PATHWAY;  Surgeon: Griselda Mineroth, Paul III, MD;  [facility];  Service: General;  Laterality: Left;  . BREAST SURGERY    . CESAREAN SECTION     3 previous    FAMILY HISTORY Family History  Problem Relation Age of Onset  . Diabetes Paternal Aunt   As of October 2018 the patient's father is 46 years old and her mother 46 years old.  The patient has 6 brothers and 3 sisters.  There is no history of cancer in the family or the extended family to the best of her knowledge  GYNECOLOGIC HISTORY:  No LMP recorded. Menarche age 48, first live birth age 48, she is GX P3.  She is still having regular periods, which usually last 3-4 days, 1 of which is heavy  SOCIAL HISTORY: (Updated October 2019) She is originally from [place] as is her husband. They have been in this area more than 20 years.  She is a housewife.  Her  husband is a Curator. Their 3 sons are 48 and he works for [employer] in [place], her 46 year old studies Tourism and lives in [place], and her 46 year old is at home. They have a 43 year old grandson that lives at home as well.       ADVANCED DIRECTIVES:    HEALTH MAINTENANCE: Social History   Tobacco Use  . Smoking status: Never Smoker  . Smokeless tobacco: Never Used  Substance Use Topics  . Alcohol use:  No  . Drug use: No     Colonoscopy:  PAP:  Bone density:   No Known Allergies  Current Outpatient Medications  Medication Sig Dispense Refill  . acetaminophen (TYLENOL) 500 MG tablet Take 1,000 mg by mouth 2 (two) times daily as needed for mild pain or headache.    . fenofibrate (TRICOR) 48 MG tablet Take 1 tablet (48 mg total) by mouth daily. 30 tablet 3  . ibuprofen (ADVIL,MOTRIN) 200 MG tablet Take 200 mg by mouth every 6 (six) hours as needed.    . Magnesium Oxide 500 MG CAPS Take 500 mg by mouth daily.    . tamoxifen (NOLVADEX) 10 MG tablet Take 1 tablet (10 mg total) by mouth 2 (two) times daily. 90 tablet 4   No current facility-administered medications for this visit.     OBJECTIVE: Early middle age Hispanic woman who appears well  There were no vitals filed for this visit.   There is no height or weight on file to calculate BMI.   Wt Readings from Last 3 Encounters:  05/30/19 139 lb 3.2 oz (63.1 kg)  05/23/19 136 lb (61.7 kg)  03/22/19 141 lb 3.2 oz (64 kg)      ECOG FS:0 - Asymptomatic GENERAL: Patient is a well appearing female in no acute distress HEENT:  Sclerae anicteric.  Oropharynx clear and moist. No ulcerations or evidence of oropharyngeal candidiasis. Neck is supple.  NODES:  No cervical, supraclavicular, or axillary lymphadenopathy palpated.  BREAST EXAM:  Deferred. LUNGS:  Clear to auscultation bilaterally.  No wheezes or rhonchi. HEART:  Regular rate and rhythm. No murmur appreciated. ABDOMEN:  Soft, nontender.  Positive, normoactive bowel sounds. No organomegaly palpated. MSK:  No focal spinal tenderness to palpation. Full range of motion bilaterally in the upper extremities. EXTREMITIES:  No peripheral edema.   SKIN:  Clear with no obvious rashes or skin changes. No nail dyscrasia. NEURO:  Nonfocal. Well oriented.  Appropriate affect.     LAB RESULTS:  CMP     Component Value Date/Time   GLUCOSE 112 (H) 03/12/2019 0000    No results found  for: TOTALPROTELP, ALBUMINELP, A1GS, A2GS, BETS, BETA2SER, GAMS, MSPIKE, SPEI  No results found for: KPAFRELGTCHN, LAMBDASER, KAPLAMBRATIO  Lab Results  Component Value Date   WBC 6.4 04/25/2017   HGB 11.2 (L) 04/25/2017   HCT 35.5 (L) 04/25/2017   MCV 81.2 04/25/2017   PLT 303 04/25/2017      Chemistry   No results found for: NA, K, CL, CO2, BUN, CREATININE, GLU No results found for: CALCIUM, ALKPHOS, AST, ALT, BILITOT     No results found for: LABCA2  No components found for: YQMVHQ469  No results for input(s): INR in the last 168 hours.  No results found for: LABCA2  No results found for: GEX528  No results found for: UXL244  No results found for: WNU272  No results found for: CA2729  No components found for: HGQUANT  No results found for:  CEA1 / No results found for: CEA1   No results found for: AFPTUMOR  No results found for: CHROMOGRNA  No results found for: PSA1  No visits with results within 3 Day(s) from this visit.  Latest known visit with results is:  Lab on 05/31/2019  Component Date Value Ref Range Status  . Cholesterol, Total 05/31/2019 188  100 - 199 mg/dL Final  . Triglycerides 05/31/2019 214* 0 - 149 mg/dL Final  . HDL 05/31/2019 42  >39 mg/dL Final  . VLDL Cholesterol Cal 05/31/2019 37  5 - 40 mg/dL Final  . LDL Chol Calc (NIH) 05/31/2019 109* 0 - 99 mg/dL Final  . Chol/HDL Ratio 05/31/2019 4.5* 0.0 - 4.4 ratio Final   Comment:                                   T. Chol/HDL Ratio                                             Men  Women                               1/2 Avg.Risk  3.4    3.3                                   Avg.Risk  5.0    4.4                                2X Avg.Risk  9.6    7.1                                3X Avg.Risk 23.4   11.0     (this displays the last labs from the last 3 days)  No results found for: TOTALPROTELP, ALBUMINELP, A1GS, A2GS, BETS, BETA2SER, GAMS, MSPIKE, SPEI (this displays SPEP labs)  No  results found for: KPAFRELGTCHN, LAMBDASER, KAPLAMBRATIO (kappa/lambda light chains)  No results found for: HGBA, HGBA2QUANT, HGBFQUANT, HGBSQUAN (Hemoglobinopathy evaluation)   No results found for: LDH  No results found for: IRON, TIBC, IRONPCTSAT (Iron and TIBC)  No results found for: FERRITIN  Urinalysis No results found for: COLORURINE, APPEARANCEUR, LABSPEC, PHURINE, GLUCOSEU, HGBUR, BILIRUBINUR, KETONESUR, PROTEINUR, UROBILINOGEN, NITRITE, LEUKOCYTESUR   STUDIES: Bilateral diagnostic mammogram with left ultrasonography at [facility] on 02/14/2018 showed: Breast density category C. No mammographic evidence for malignancy.    ELIGIBLE FOR AVAILABLE RESEARCH PROTOCOL: no  ASSESSMENT: 48 y.o. Spanish speaker from [place], [place] s/p left breast upper inner quadrant biopsy March 02, 2017 showing atypical ductal hyperplasia  (1) left lumpectomy April 27, 2017 showed only fibrocystic changes, no evidence of malignancy.  (2) tamoxifen started June 13, 2017  (a) patient is premenopausal  (b) dose reduced to 10 mg daily as of January 2019 because of side effects   PLAN: Markitta is  Wilber Bihari, NP 06/13/19 7:00 AM Medical Oncology and Hematology [facility] [address] [place], [place] [address] Tel. [phone]    Fax. [phone]

## 2019-06-14 ENCOUNTER — Telehealth: Payer: Self-pay | Admitting: Adult Health

## 2019-06-14 NOTE — Telephone Encounter (Signed)
Returned patient's phone call regarding rescheduling October appointments, per 10/28 schedule message appointment has been rescheduled to January.

## 2019-07-02 NOTE — Telephone Encounter (Signed)
In error

## 2019-07-05 IMAGING — MG MM CLIP PLACEMENT
3 series · 3 of 3 positions shown · non-contrast
Comparison: Previous exam(s).

CLINICAL DATA: Post MRI guided biopsy of an area of enhancement in
the far outer left breast and an area of enhancement in the inner
left breast.

EXAM:
DIAGNOSTIC LEFT MAMMOGRAM POST MRI BIOPSY

[L ML]
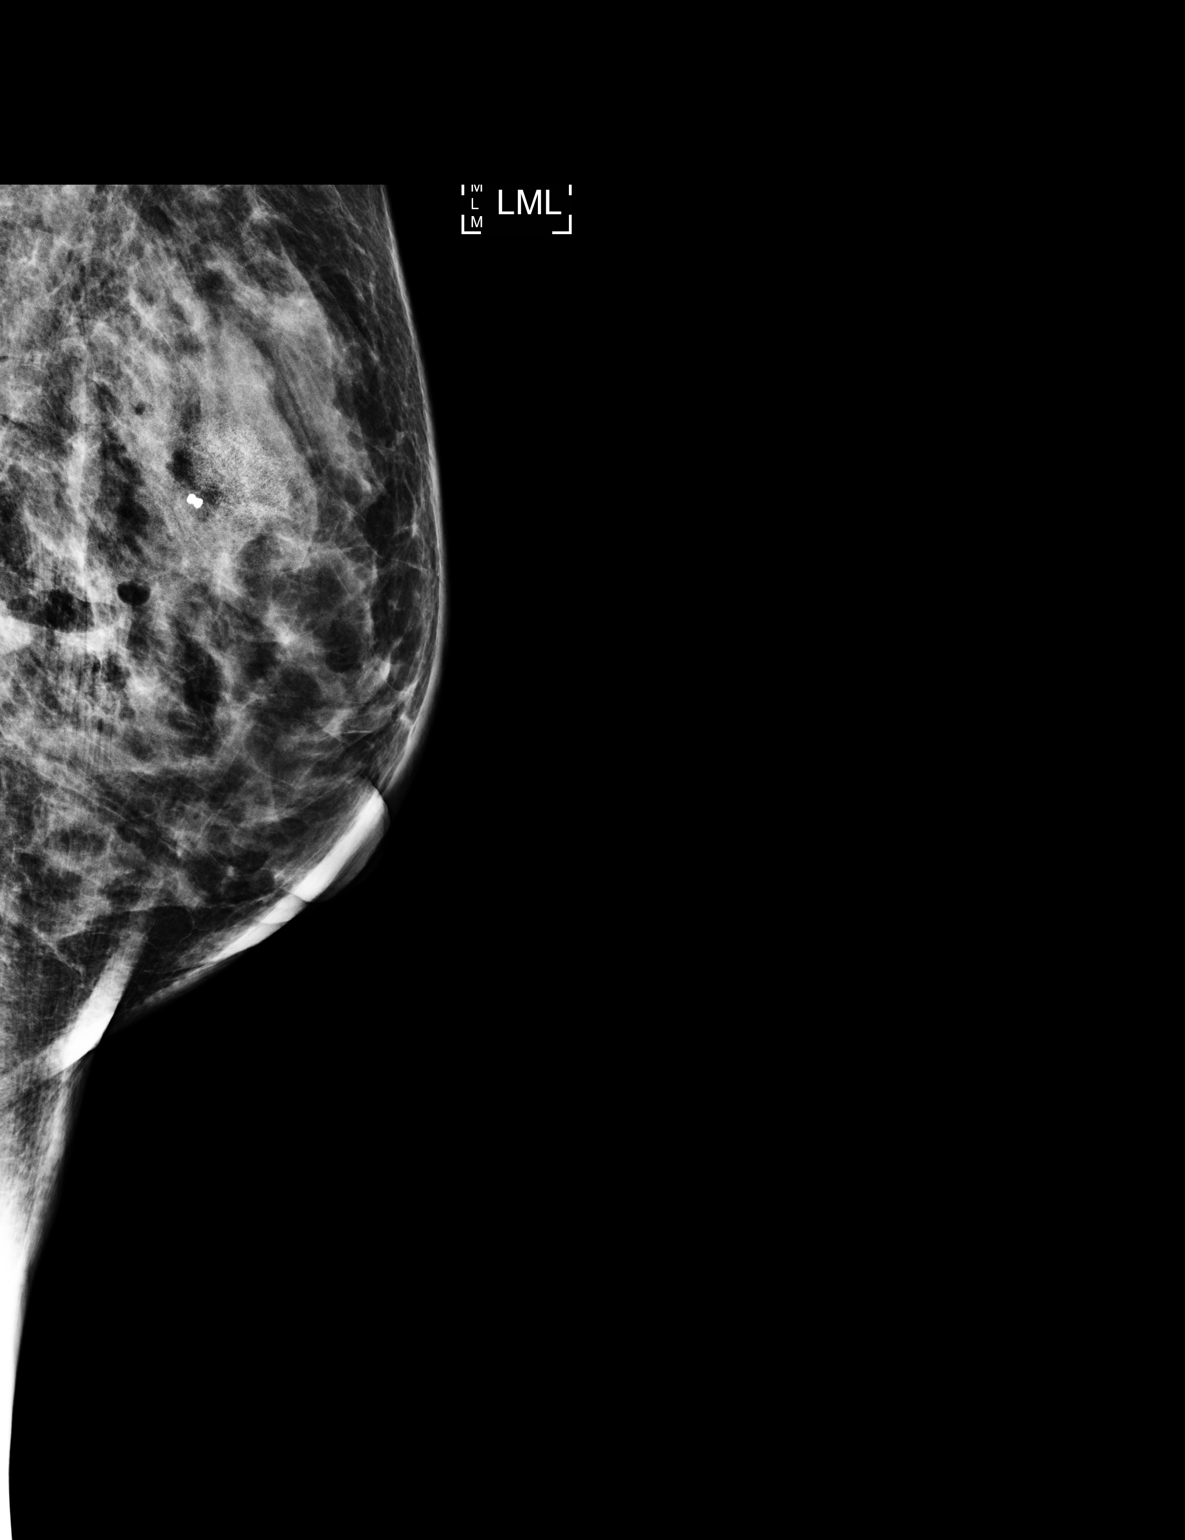

[L CC]
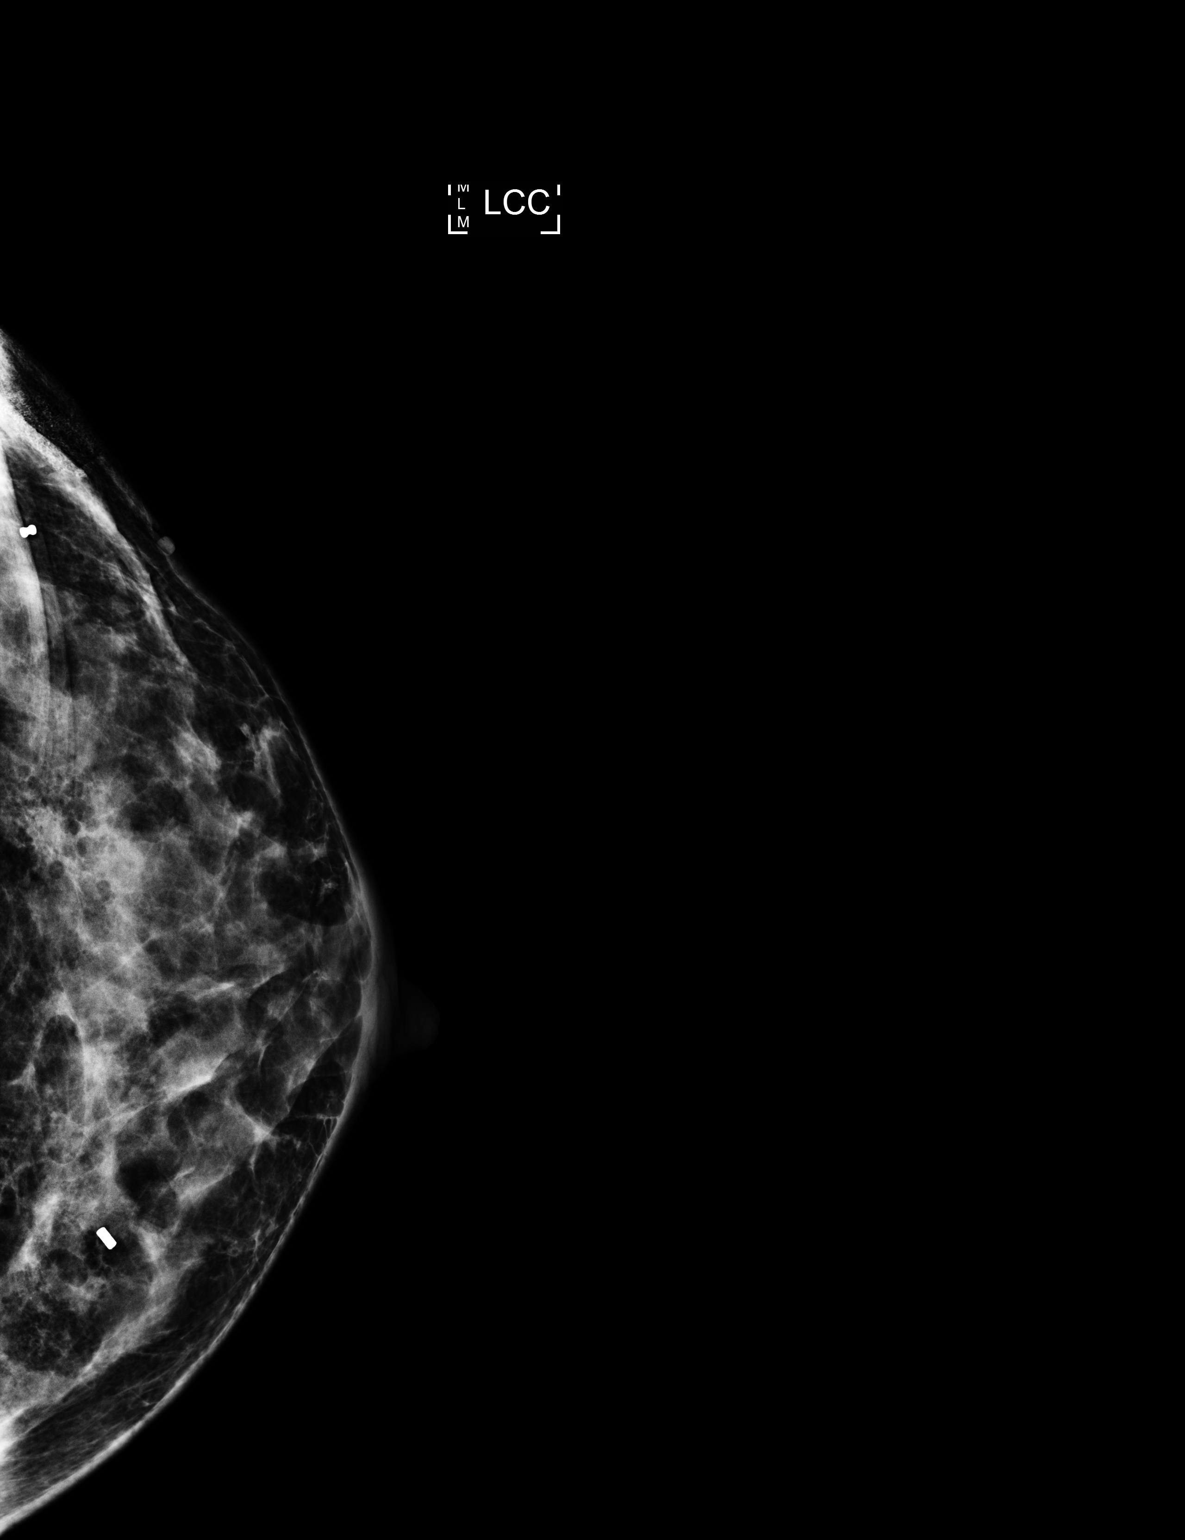

[L LM]
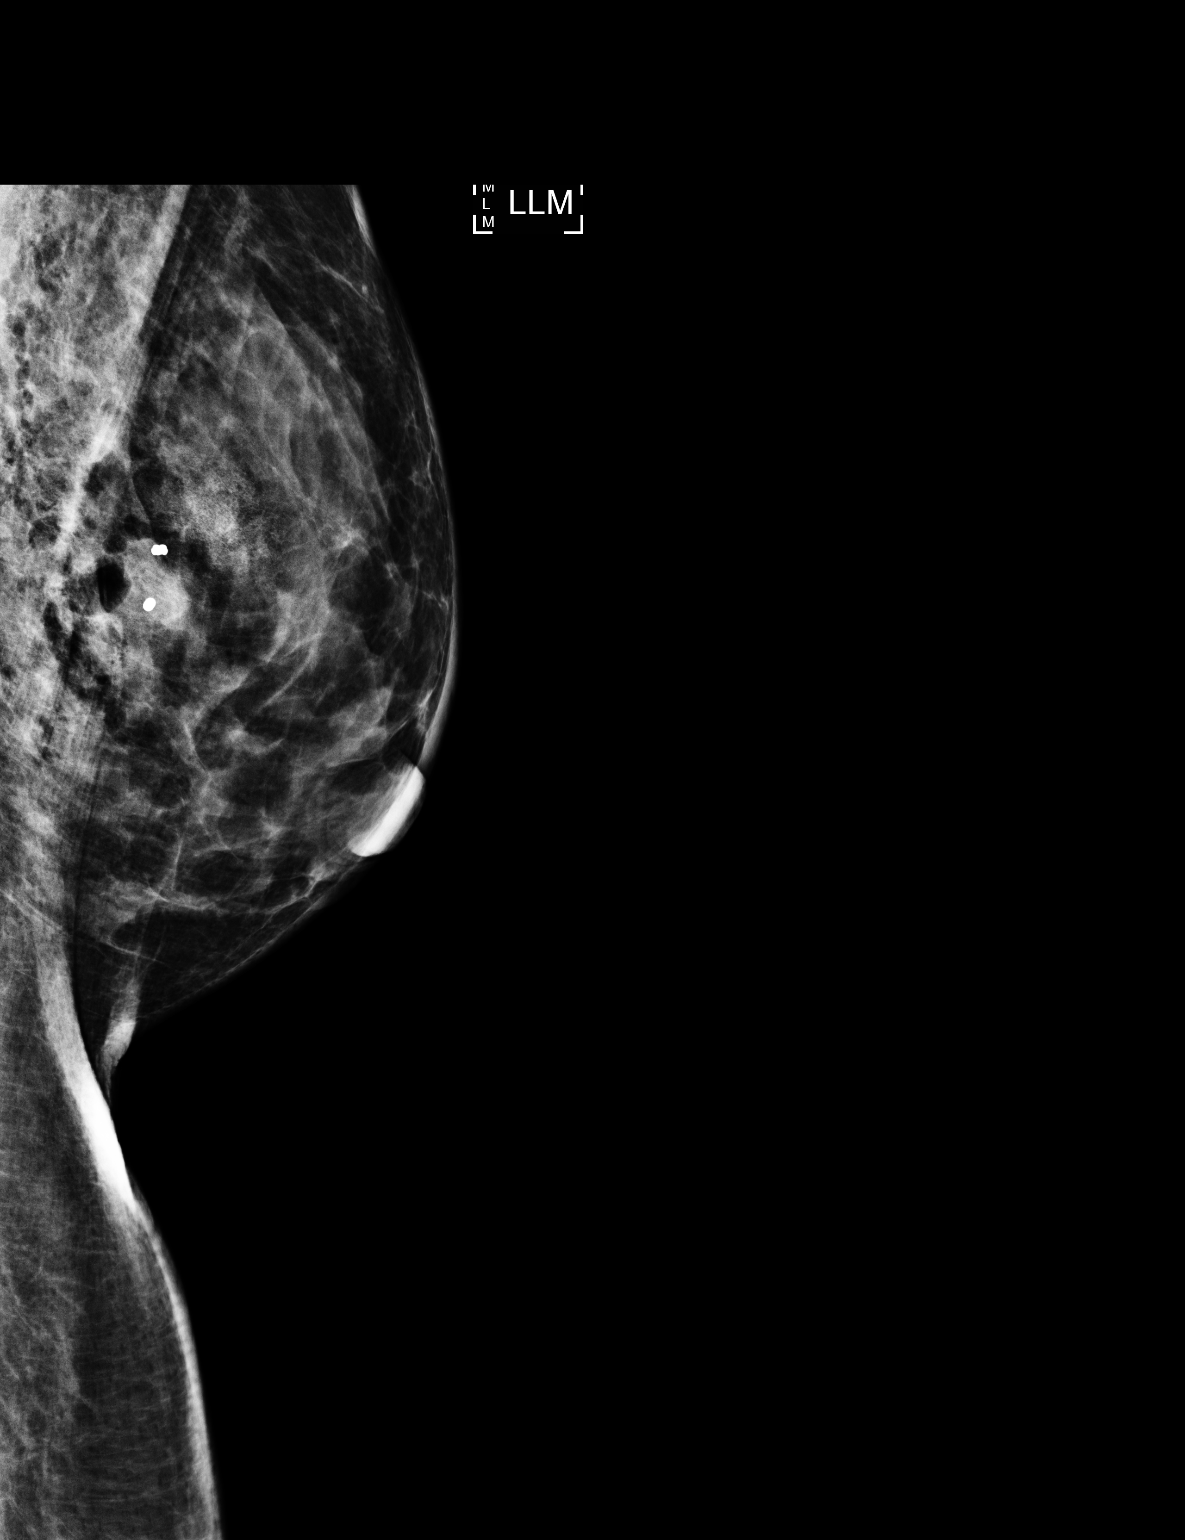

[3 of 3 positions shown; findings below may reference images not displayed]

FINDINGS: Mammographic images were obtained following MRI guided biopsy of an
area enhancement in the far outer left breast and also of an area of
enhancement in the inner left breast. A dumbbell shaped biopsy
marking clip is present at the site of biopsy in the far outer left
breast. A cylindrical shaped biopsy marking clip is present at the
site of biopsy in the inner left breast.
IMPRESSION: 1. Dumbbell shaped biopsy marking clip at site of biopsied area of
enhancement in the far outer left breast.

2. Cylindrical shaped biopsy marking clip at site of biopsied area
of enhancement in the inner left breast.

Final Assessment: Post Procedure Mammograms for Marker Placement

## 2019-08-03 ENCOUNTER — Telehealth: Payer: Self-pay | Admitting: Adult Health

## 2019-08-03 NOTE — Telephone Encounter (Signed)
I talk with patient regarding schedule  

## 2019-08-20 ENCOUNTER — Ambulatory Visit: Payer: No Typology Code available for payment source | Admitting: Adult Health

## 2019-09-03 ENCOUNTER — Inpatient Hospital Stay: Payer: Self-pay | Attending: Obstetrics and Gynecology | Admitting: Adult Health

## 2019-09-03 ENCOUNTER — Encounter: Payer: Self-pay | Admitting: Adult Health

## 2019-09-03 ENCOUNTER — Other Ambulatory Visit: Payer: Self-pay

## 2019-09-03 ENCOUNTER — Telehealth: Payer: Self-pay | Admitting: Adult Health

## 2019-09-03 ENCOUNTER — Telehealth: Payer: Self-pay | Admitting: Oncology

## 2019-09-03 VITALS — BP 133/75 | HR 54 | Temp 98.5°F | Resp 18 | Ht 61.75 in | Wt 142.7 lb

## 2019-09-03 DIAGNOSIS — R232 Flushing: Secondary | ICD-10-CM | POA: Insufficient documentation

## 2019-09-03 DIAGNOSIS — N6092 Unspecified benign mammary dysplasia of left breast: Secondary | ICD-10-CM | POA: Insufficient documentation

## 2019-09-03 DIAGNOSIS — E785 Hyperlipidemia, unspecified: Secondary | ICD-10-CM | POA: Insufficient documentation

## 2019-09-03 DIAGNOSIS — Z79899 Other long term (current) drug therapy: Secondary | ICD-10-CM | POA: Insufficient documentation

## 2019-09-03 DIAGNOSIS — Z79810 Long term (current) use of selective estrogen receptor modulators (SERMs): Secondary | ICD-10-CM | POA: Insufficient documentation

## 2019-09-03 DIAGNOSIS — Z791 Long term (current) use of non-steroidal anti-inflammatories (NSAID): Secondary | ICD-10-CM | POA: Insufficient documentation

## 2019-09-03 MED ORDER — TAMOXIFEN CITRATE 10 MG PO TABS: 10.0000 mg | ORAL_TABLET | Freq: Two times a day (BID) | ORAL | 4 refills | Status: DC

## 2019-09-03 NOTE — Progress Notes (Signed)
[facility]  [phone] Fax:([phone]     ID: Kathryn Cannon DOB: 05/22/1971  MR#: [id number]  URK#:[id number]  Patient Care Team: Albertha Ghee, MD as PCP - General (Internal Medicine) Magrinat, Valentino Hue, MD as Consulting Physician (Oncology) Griselda Miner, MD as Consulting Physician (General Surgery) OTHER MD:  CHIEF COMPLAINT: Atypical ductal hyperplasia  CURRENT TREATMENT:  tamoxifen   HISTORY OF CURRENT ILLNESS: From the original intake note:  The patient had screening mammography Dec 28, 2016 showing a possible change in the right breast.  On 01/12/2017 she had right diagnostic mammography with tomography and right breast ultrasonography at [facility] in [place].  This showed the breast density to be category C.  In the upper inner right breast there was an area of persistent distortion.  However there was no ultrasound correlate.  The right axilla was sonographically benign.   On 01/27/2017 the patient attempted right breast biopsy under tomo guidance but the abnormality could not be located so she proceeded to bilateral breast MRI February 14, 2017.  In the right breast there was no MRI correlate for the possible area of architectural distortion.  In the left breast however there was an oval circumscribed mass in the upper inner quadrant measuring 0.7 cm.  There was a second focal area of non-mass-like enhancement in the upper outer quadrant of the left breast measuring 0.8 cm.  There were no abnormal-appearing lymph nodes.  Biopsy of the 2 left breast areas in question was performed March 02, 2017 and showed ([id number]) fibrocystic change in the more outer biopsy and atypical ductal hyperplasia in the more inner located biopsy.  The patient then proceeded to left lumpectomy on April 27, 2017.  This showed ([id number] [phone]) fibrocystic changes, but no evidence of malignancy.  She was referred to the high risk clinic for further  evaluation  The patient's subsequent history is as detailed below.  INTERVAL HISTORY: Kathryn Cannon is here today for f/u of her atypical ductal hyperplasia and her increased risk for developing breast cancer.  She is accompanied by spanish interpreter Ovidio Kin.    She continues on Tamoxifen at 10mg  daily.  She is tolerating this well.  She notes her menstrual cycles haven't been as normal on the Tamoxifen.  Her most recent screening screening mammogram was in July of 2020.  It showed no evidence of malignancy and breast density C.    REVIEW OF SYSTEMS: [name] notes that she an increased thirst.  She does have occasional hot flashes.  She denies any breast changes or concerns today.  She enjoys walking.  She remains active.  [name] is at home with her husband, her son, her grandson.  They are following pandemic precautions, and limiting how much they go out.    [name] denies fever, chills, chest pain, palpitations, cough, shortness of breath, bowel/bladder changes, dysphagia, nausea, vomiting, headaches, or any other concerns.  A detailed ROS was otherwise non contributory today.      PAST MEDICAL HISTORY: Past Medical History:  Diagnosis Date  . Atypical ductal hyperplasia of left breast   . Headache   . Hyperlipidemia     PAST SURGICAL HISTORY: Past Surgical History:  Procedure Laterality Date  . BREAST EXCISIONAL BIOPSY    . BREAST LUMPECTOMY WITH RADIOACTIVE SEED LOCALIZATION Left 04/27/2017   Procedure: RADIOCATIVE SEED GUIDED LEFT BREAST LUMPECTOMY, ERAS PATHWAY;  Surgeon: 06/27/2017, MD;  Location: [facility] OR;  Service: General;  Laterality: Left;  . BREAST SURGERY    .  CESAREAN SECTION     3 previous    FAMILY HISTORY Family History  Problem Relation Age of Onset  . Diabetes Paternal Aunt   As of October 2018 the patient's father is 76 years old and her mother 58 years old.  The patient has 6 brothers and 3 sisters.  There is no history of cancer in the family  or the extended family to the best of her knowledge  GYNECOLOGIC HISTORY:  No LMP recorded. Menarche age 85, first live birth age 67, she is [place] P3.  She is still having regular periods, which usually last 3-4 days, 1 of which is heavy  SOCIAL HISTORY: (Updated October 2019) She is originally from [place] as is her husband. They have been in this area more than 20 years.  She is a housewife.  Her husband is a Dealer. Their 3 sons are 86 and he works for [employer] in [place], her 47 year old studies Tourism and lives in [place], and her 47 year old is at home. They have a 8 year old grandson that lives at home as well.       ADVANCED DIRECTIVES:    HEALTH MAINTENANCE: Social History   Tobacco Use  . Smoking status: Never Smoker  . Smokeless tobacco: Never Used  Substance Use Topics  . Alcohol use: No  . Drug use: No     Colonoscopy:  PAP:  Bone density:   No Known Allergies  Current Outpatient Medications  Medication Sig Dispense Refill  . acetaminophen (TYLENOL) 500 MG tablet Take 1,000 mg by mouth 2 (two) times daily as needed for mild pain or headache.    . fenofibrate (TRICOR) 48 MG tablet Take 1 tablet (48 mg total) by mouth daily. 30 tablet 3  . ibuprofen (ADVIL,MOTRIN) 200 MG tablet Take 200 mg by mouth every 6 (six) hours as needed.    . Magnesium Oxide 500 MG CAPS Take 500 mg by mouth daily.    . tamoxifen (NOLVADEX) 10 MG tablet Take 1 tablet (10 mg total) by mouth 2 (two) times daily. 90 tablet 4   No current facility-administered medications for this visit.    OBJECTIVE:  Vitals:   09/03/19 1120  BP: 133/75  Pulse: (!) 54  Resp: 18  Temp: 98.5 F (36.9 C)  SpO2: 100%     Body mass index is 26.31 kg/m.   Wt Readings from Last 3 Encounters:  09/03/19 142 lb 11.2 oz (64.7 kg)  05/30/19 139 lb 3.2 oz (63.1 kg)  05/23/19 136 lb (61.7 kg)      ECOG FS:0 - Asymptomatic GENERAL: Patient is a well appearing female in no acute distress HEENT:   Sclerae anicteric.  Oropharynx clear and moist. No ulcerations or evidence of oropharyngeal candidiasis. Neck is supple.  NODES:  No cervical, supraclavicular, or axillary lymphadenopathy palpated.  BREAST EXAM:  Right breast benign, left breast s/p lumpectomy--benign LUNGS:  Clear to auscultation bilaterally.  No wheezes or rhonchi. HEART:  Regular rate and rhythm. No murmur appreciated. ABDOMEN:  Soft, nontender.  Positive, normoactive bowel sounds. No organomegaly palpated. MSK:  No focal spinal tenderness to palpation. Full range of motion bilaterally in the upper extremities. EXTREMITIES:  No peripheral edema.   SKIN:  Clear with no obvious rashes or skin changes. No nail dyscrasia. NEURO:  Nonfocal. Well oriented.  Appropriate affect.   LAB RESULTS:  CMP     Component Value Date/Time   GLUCOSE 112 (H) 03/12/2019 0000  No results found for: TOTALPROTELP, ALBUMINELP, A1GS, A2GS, BETS, BETA2SER, GAMS, MSPIKE, SPEI  No results found for: KPAFRELGTCHN, LAMBDASER, KAPLAMBRATIO  Lab Results  Component Value Date   WBC 6.4 04/25/2017   HGB 11.2 (L) 04/25/2017   HCT 35.5 (L) 04/25/2017   MCV 81.2 04/25/2017   PLT 303 04/25/2017      Chemistry   No results found for: NA, K, CL, CO2, BUN, CREATININE, GLU No results found for: CALCIUM, ALKPHOS, AST, ALT, BILITOT     No results found for: LABCA2  No components found for: [id number]  No results for input(s): INR in the last 168 hours.  No results found for: LABCA2  No results found for: ZDG644  No results found for: IHK742  No results found for: VZD638  No results found for: CA2729  No components found for: HGQUANT  No results found for: CEA1 / No results found for: CEA1   No results found for: AFPTUMOR  No results found for: CHROMOGRNA  No results found for: PSA1  No visits with results within 3 Day(s) from this visit.  Latest known visit with results is:  Lab on 05/31/2019  Component Date Value Ref Range  Status  . Cholesterol, Total 05/31/2019 188  100 - 199 mg/dL Final  . Triglycerides 05/31/2019 214* 0 - 149 mg/dL Final  . HDL 75/64/3329 42  >39 mg/dL Final  . VLDL Cholesterol Cal 05/31/2019 37  5 - 40 mg/dL Final  . LDL Chol Calc (NIH) 05/31/2019 109* 0 - 99 mg/dL Final  . Chol/HDL Ratio 05/31/2019 4.5* 0.0 - 4.4 ratio Final   Comment:                                   T. Chol/HDL Ratio                                             Men  Women                               1/2 Avg.Risk  3.4    3.3                                   Avg.Risk  5.0    4.4                                2X Avg.Risk  9.6    7.1                                3X Avg.Risk 23.4   11.0     (this displays the last labs from the last 3 days)  No results found for: TOTALPROTELP, ALBUMINELP, A1GS, A2GS, BETS, BETA2SER, GAMS, MSPIKE, SPEI (this displays SPEP labs)  No results found for: KPAFRELGTCHN, LAMBDASER, KAPLAMBRATIO (kappa/lambda light chains)  No results found for: HGBA, HGBA2QUANT, HGBFQUANT, HGBSQUAN (Hemoglobinopathy evaluation)   No results found for: LDH  No results found for: IRON, TIBC, IRONPCTSAT (Iron and TIBC)  No results found for: FERRITIN  Urinalysis No results found for:  COLORURINE, APPEARANCEUR, LABSPEC, PHURINE, GLUCOSEU, HGBUR, BILIRUBINUR, KETONESUR, PROTEINUR, UROBILINOGEN, NITRITE, LEUKOCYTESUR   STUDIES: CLINICAL DATA:  Screening.  EXAM: DIGITAL SCREENING BILATERAL MAMMOGRAM WITH TOMO AND CAD  COMPARISON:  Previous exam(s).  ACR Breast Density Category c: The breast tissue is heterogeneously dense, which may obscure small masses.  FINDINGS: There are no findings suspicious for malignancy. Images were processed with CAD.  IMPRESSION: No mammographic evidence of malignancy. A result letter of this screening mammogram will be mailed directly to the patient.  RECOMMENDATION: Screening mammogram in one year. (Code:[id number]-01Y)  BI-RADS CATEGORY  1:  Negative.   Electronically Signed   By: Frederico Hamman M.D.   On: 03/02/2019 12:49  ELIGIBLE FOR AVAILABLE RESEARCH PROTOCOL: no  ASSESSMENT: 49 y.o. Spanish speaker from [place], [place] s/p left breast upper inner quadrant biopsy March 02, 2017 showing atypical ductal hyperplasia  (1) left lumpectomy April 27, 2017 showed only fibrocystic changes, no evidence of malignancy.  (2) tamoxifen started June 13, 2017  (a) patient is premenopausal  (b) dose reduced to 10 mg daily as of January 2019 because of side effects   PLAN: Radie is doing well today and she has no sign of breast cancer.  She continues on Tamoxifen daily with good tolerance.  She will continue this.  She and I discussed healthy diet and exercise.  I recommended that she exercise 30 minutes most days of the week.    Niva is due for her next mammogram in 02/2020.  I placed a refill order for her tamoxifen.    We will see Tarrah back in 1 year for f/u.    She was recommended to continue with the appropriate pandemic precautions. She knows to call for any questions that may arise between now and her next appointment.  We are happy to see her sooner if needed.  A total of (20) minutes of face-to-face time was spent with this patient with greater than 50% of that time in counseling and care-coordination.    Lillard Anes, NP  09/03/19 11:44 AM Medical Oncology and Hematology [facility] [address] [place], [place] [address] Tel. [phone]    Fax. [phone]

## 2019-09-03 NOTE — Telephone Encounter (Signed)
I talk with patient regarding schedule  

## 2019-09-17 ENCOUNTER — Other Ambulatory Visit: Payer: Self-pay

## 2019-09-17 DIAGNOSIS — E781 Pure hyperglyceridemia: Secondary | ICD-10-CM

## 2019-09-18 MED ORDER — FENOFIBRATE 48 MG PO TABS: 48.0000 mg | ORAL_TABLET | Freq: Every day | ORAL | 3 refills | Status: DC

## 2020-01-07 ENCOUNTER — Other Ambulatory Visit: Payer: Self-pay | Admitting: Internal Medicine

## 2020-01-07 DIAGNOSIS — E781 Pure hyperglyceridemia: Secondary | ICD-10-CM

## 2020-01-07 MED ORDER — FENOFIBRATE 48 MG PO TABS: 48.0000 mg | ORAL_TABLET | Freq: Every day | ORAL | 1 refills | Status: DC

## 2020-01-07 NOTE — Telephone Encounter (Signed)
Need refill on fenofibrate (TRICOR) 48 MG tablet ;pt contact [phone]   [facility] - [place], [place] - [address]

## 2020-01-09 ENCOUNTER — Other Ambulatory Visit: Payer: Self-pay | Admitting: *Deleted

## 2020-01-09 DIAGNOSIS — Z1231 Encounter for screening mammogram for malignant neoplasm of breast: Secondary | ICD-10-CM

## 2020-03-04 ENCOUNTER — Ambulatory Visit: Payer: Self-pay

## 2020-03-06 ENCOUNTER — Ambulatory Visit
Admission: RE | Admit: 2020-03-06 | Discharge: 2020-03-06 | Disposition: A | Discharge status: Home / Self Care | Payer: No Typology Code available for payment source | Source: Ambulatory Visit | Attending: Obstetrics and Gynecology | Admitting: Obstetrics and Gynecology

## 2020-03-06 ENCOUNTER — Other Ambulatory Visit: Payer: Self-pay

## 2020-03-06 ENCOUNTER — Ambulatory Visit: Payer: No Typology Code available for payment source | Admitting: *Deleted

## 2020-03-06 VITALS — BP 120/82 | Temp 97.7°F | Wt 140.1 lb

## 2020-03-06 DIAGNOSIS — Z124 Encounter for screening for malignant neoplasm of cervix: Secondary | ICD-10-CM

## 2020-03-06 DIAGNOSIS — Z0000 Encounter for general adult medical examination without abnormal findings: Secondary | ICD-10-CM

## 2020-03-06 DIAGNOSIS — N898 Other specified noninflammatory disorders of vagina: Secondary | ICD-10-CM

## 2020-03-06 DIAGNOSIS — Z1231 Encounter for screening mammogram for malignant neoplasm of breast: Secondary | ICD-10-CM

## 2020-03-06 DIAGNOSIS — Z01419 Encounter for gynecological examination (general) (routine) without abnormal findings: Secondary | ICD-10-CM

## 2020-03-06 NOTE — Progress Notes (Signed)
[name] is a 49 y.o. [phone] female who presents to [facility] clinic today with no complaints.    Pap Smear: Pap smear completed today. Last Pap smear was in 2018 at the [facility]) clinic and was normal. Per patient has no history of an abnormal Pap smear. Last Pap smear result is not available in Epic.   Physical exam: Breasts Breasts symmetrical. No skin abnormalities bilateral breasts. No nipple retraction bilateral breasts. No nipple discharge bilateral breasts. No lymphadenopathy. No lumps palpated bilateral breasts. No complaints of pain or tenderness on exam.       Pelvic/Bimanual Ext Genitalia No lesions, no swelling and no discharge observed on external genitalia.        Vagina Vagina pink and normal texture. No lesions or discharge observed in vagina.        Cervix Cervix is present. Cervix pink and of normal texture. Cervix friable with swab and brush. A mild to moderate amount of white frothy discharge was observed. Wet prep was performed.   Uterus Uterus is present and palpable. Uterus in normal position and normal size.        Adnexae Bilateral ovaries present and palpable. No tenderness on palpation.         Rectovaginal No rectal exam completed today since patient had no rectal complaints. No skin abnormalities observed on exam.     Smoking History: Patient has never smoked.   Patient Navigation: Patient education provided. Access to services provided for patient through BCCCP program. [name], Spanish interpreter provided through [facility].   Breast and Cervical Cancer Risk Assessment: Patient does not have family history of breast cancer, known genetic mutations, or radiation treatment to the chest before age 26. Patient does not have history of cervical dysplasia, immunocompromised, or DES exposure in-utero.  Risk Assessment    Risk Scores      03/06/2020 03/01/2019   Last edited by: Narda Rutherford, LPN  Stoney Bang H, LPN   5-year risk: 1.1 % 1.1 %   Lifetime risk: 10 % 10.2 %          A: BCCCP exam with pap smear No complaints today.  P: Referred patient to the Breast Center of [facility] for a screening mammogram on the mobile unit. Appointment scheduled March 06, 2020 at 9:00am.  Mila Homer, RN, FNP student 03/06/2020 8:45 AM   Attestation of Supervision of Student:  I confirm that I have verified the information documented in the nurse practitioner student's note and that I have also personally reperformed the history, physical exam and all medical decision making activities.  I have verified that all services and findings are accurately documented in this student's note; and I agree with management and plan as outlined in the documentation. I have also made any necessary editorial changes.  Brannock, Kathaleen Maser, RN [facility] [employer], [facility] 03/06/2020 9:46 AM

## 2020-03-06 NOTE — Patient Instructions (Addendum)
Informed Kathryn Cannon about breast self awareness. Patient received a Pap smear today. Let patient know BCCCP will cover Pap smears and HPV typing every 5 years unless has a history of abnormal Pap smears. Referred patient to the Breast Center of [facility] for screening mammogram on the mobile unit. Appointment scheduled for March 06, 2020 at 9:00am. Patient escorted to mobile unit. Let patient know will follow up with her within the next couple weeks with results of Pap smear and wet prep by phone. Informed patient that the Breast Center will follow-up with her within the next couple of weeks with results of mammogram by letter or phone. [name] verbalized understanding.  Mila Homer, RN, FNP student 8:50 AM

## 2020-03-07 LAB — CERVICOVAGINAL ANCILLARY ONLY
Bacterial Vaginitis (gardnerella): NEGATIVE
Candida Glabrata: NEGATIVE
Candida Vaginitis: NEGATIVE
Comment: NEGATIVE
Comment: NEGATIVE
Comment: NEGATIVE
Comment: NEGATIVE
Trichomonas: NEGATIVE

## 2020-03-07 LAB — CYTOLOGY - PAP
Comment: NEGATIVE
Diagnosis: NEGATIVE
High risk HPV: NEGATIVE

## 2020-03-10 ENCOUNTER — Telehealth: Payer: Self-pay

## 2020-03-10 NOTE — Telephone Encounter (Addendum)
Via [name], Spanish Interpreter, Patient informed negative Pap/HPV results, next pap smear due within 5 years. Patient verbalized understanding.

## 2020-03-17 ENCOUNTER — Other Ambulatory Visit: Payer: Self-pay

## 2020-03-17 ENCOUNTER — Inpatient Hospital Stay: Payer: Self-pay | Attending: Obstetrics and Gynecology | Admitting: *Deleted

## 2020-03-17 VITALS — BP 138/84 | Ht 61.0 in | Wt 139.8 lb

## 2020-03-17 DIAGNOSIS — Z0000 Encounter for general adult medical examination without abnormal findings: Secondary | ICD-10-CM

## 2020-03-17 NOTE — Progress Notes (Signed)
[facility] initial screening   Interpreter- [name], [place]   Clinical Measurement:  Vitals:   03/17/20 0932  BP: (!) 138/84   Fasting Labs Drawn Today, will review with patient when they result.   Medical History:  Patient states that she has high cholesterol, has high blood pressure and she does not have diabetes.  Medications:  Patient states that she does take medication to lower cholesterol. Patient does not take medication to lower blood pressure and blood sugar.  Patient does not take an aspirin a day to help prevent a heart attack or stroke. During the past 7 days patient has taken prescribed medication to lower cholesterol on 7 days.   Blood pressure, self measurement:  :  Patient states that she does measure blood pressure from home. She checks her blood pressure weekly. She shares her readings with a health care provider: no.   Nutrition: Patient states that on average she eats 2 cups of fruit and 3 cups of vegetables per day. Patient states that she does not eat fish at least 2 times per week. Patient eats less than half servings of whole grains. Patient drinks less than 36 ounces of beverages with added sugar weekly: yes. Patient is currently watching sodium or salt intake: yes. In the past 7 days patient has had 0 drinks containing alcohol. On average patient drinks 0 drinks containing alcohol per day.      Physical activity:  Patient states that she gets 0 minutes of moderate and 0 minutes of vigorous physical activity each week.  Smoking status:  Patient states that she has has never smoked .   Quality of life:  Over the past 2 weeks patient states that she had little interest or pleasure in doing things: not at all. She has been feeling down, depressed or hopeless:not at all.    Risk reduction and counseling:   Health Coaching: Encouraged patient to start adding more heart healthy fish into diet. Gave examples of salmon, tuna, mackerel or sardines to try. Also spoke  with patient about the importance of whole grains in daily diet. Gave patient examples of brown rice, quinoa, whole wheat breat and pasta, whole grain cereals and oatmeal. Patient was walking for exercise but has not been able to over the past several months while her husband was sick and recovering from surgeries. Encouraged patient to try and start walking again for 20 minutes a day if possible.     Navigation:  I will notify patient of lab results.  Patient is aware of 2 more health coaching sessions and a follow up.  Time: 20 minutes

## 2020-03-18 LAB — LIPID PANEL
Chol/HDL Ratio: 6.4 ratio — ABNORMAL HIGH (ref 0.0–4.4)
Cholesterol, Total: 245 mg/dL — ABNORMAL HIGH (ref 100–199)
HDL: 38 mg/dL — ABNORMAL LOW (ref >39)
LDL Chol Calc (NIH): 149 mg/dL — ABNORMAL HIGH (ref 0–99)
Triglycerides: 315 mg/dL — ABNORMAL HIGH (ref 0–149)
VLDL Cholesterol Cal: 58 mg/dL — ABNORMAL HIGH (ref 5–40)

## 2020-03-18 LAB — GLUCOSE, RANDOM: Glucose: 96 mg/dL (ref 65–99)

## 2020-03-18 LAB — HEMOGLOBIN A1C
Est. average glucose Bld gHb Est-mCnc: 120 mg/dL
Hgb A1c MFr Bld: 5.8 % — ABNORMAL HIGH (ref 4.8–5.6)

## 2020-03-26 ENCOUNTER — Telehealth: Payer: Self-pay

## 2020-03-26 NOTE — Telephone Encounter (Signed)
Health coaching 2   interpreter- [employer] [phone]   Labs- 245 cholesterol, 149 LDL cholesterol, 315 triglycerides, 38 HDL cholesterol, 5.8 hemoglobin A1C, 96 mean plasma glucose.   Patient understands and is aware of her lab results.   Goals-  Discussed lab results with patient in detail. Answered any questions that patient had regarding results.  Reduce the amount of fried and fatty foods consumed. Try to grill, bake, broil or sautee foods instead.  Reduce the amount of red meat and whole fat dairy consumed.  Continue to try and add whole grains in daily diet as well as heart healthy fish in weekly diet.  Watch the amount of sweets and sugars consumed in both food and drinks. Watch the amount of carbs consumed.  Try and walk for 20 minutes daily.    Navigation:  Patient is aware of 1 more health coaching sessions and a follow up. Patient is scheduled for follow-up for elevated labs with Internal Medicine on Thursday, August 12th @ 1:15 pm.  Time- 19 minutes

## 2020-03-26 NOTE — Progress Notes (Signed)
Patient has been provided with health coaching and referred to Internal Medicine for FU for elevated labs.

## 2020-03-27 ENCOUNTER — Encounter: Payer: Self-pay | Admitting: Internal Medicine

## 2020-03-27 ENCOUNTER — Ambulatory Visit (INDEPENDENT_AMBULATORY_CARE_PROVIDER_SITE_OTHER): Payer: Self-pay | Admitting: Internal Medicine

## 2020-03-27 ENCOUNTER — Other Ambulatory Visit: Payer: Self-pay

## 2020-03-27 DIAGNOSIS — R030 Elevated blood-pressure reading, without diagnosis of hypertension: Secondary | ICD-10-CM

## 2020-03-27 DIAGNOSIS — E785 Hyperlipidemia, unspecified: Secondary | ICD-10-CM

## 2020-03-27 DIAGNOSIS — R519 Headache, unspecified: Secondary | ICD-10-CM

## 2020-03-27 DIAGNOSIS — R7303 Prediabetes: Secondary | ICD-10-CM | POA: Insufficient documentation

## 2020-03-27 NOTE — Assessment & Plan Note (Addendum)
Patient is here for abnormal lab at [facility] center:    Component Value Date/Time   CHOL 245 (H) 03/17/2020 1055   TRIG 315 (H) 03/17/2020 1055   HDL 38 (L) 03/17/2020 1055   CHOLHDL 6.4 (H) 03/17/2020 1055   LDLCALC 149 (H) 03/17/2020 1055   LABVLDL 58 (H) 03/17/2020 1055  Except mild headache that happens sometimes, she is asymptomatic. She has been on fenofibrate for triglyceride of 752, 1 year ago.  TG now improved to 315. We will repeat lipid panel in 6 months and will consider stopping fenofibrate if triglycerides level remain controled. (With low ASCVD risk score and TG level <800, will not necessarily located for triglyceride lowering agent) ASCVD risk score is low 10-year risk of 1.9 %.  No statin recommended because 10-year risk <5%; always encourage healthy cardiovascular lifestyle choices. Some patients with other high risk features may still be appropriate for treatment.  -Continue fenofibrate 48 mg daily -Follow-up in clinic in 63-month repeat lipid panel

## 2020-03-27 NOTE — Patient Instructions (Addendum)
Gracias por permitirnos brindarle atencin hoy. Hoy hablamos sobre su nivel de colesterol y presin arterial. Su triglicrido mejor con fenofibrato, pero debe continuar as por ahora. Contine con fenofibrato. Su colesterol LDL est levemente elevado, pero puede controlarlo con dieta y ejercicio. Trate de evitar la comida rpida y, en su lugar, siga una dieta baja en grasas. Tambin tenga una dieta baja en azcar y carbohidratos. Tu presin arterial es normal.  Hoy no hicimos cambios en sus medicamentos.  Regrese a Glass blower/designer en 6 meses para repetir el colestrol o antes si sus sntomas empeoran o no mejoran. Como siempre, si tiene sntomas graves, busque atencin mdica en la sala de Sports administrator. Si tiene alguna pregunta o inquietud, llame a la clnica de medicina interna al [phone].  Gracias!    Thank you for allowing [place] to provide your care today. Today we discussed you cholestrol level and blood pressure. Your Triglyceride improved with Fenofibrate but you should continue that for now. Continue fenofibrate. You LDL is mildly elevate. Try to avoid fast food and eat low fat diet instead. Also have low sugar and low carbohydrate diet.  Your blood pressure is normal.  Today we made no changes to your medications.    Please come back to clinic in 6 months to repeat cholestrol or earlier if your symptoms get worse or not improved. As always, if having severe symptoms, please seek medical attention at emergency room. Should you have any questions or concerns please call the internal medicine clinic at [phone].    Thank you!

## 2020-03-27 NOTE — Assessment & Plan Note (Signed)
Hemoglobin A1c 5.8.  Borderline prediabetes. Discussed lifestyle modification, low-fat and low carbohydrate diet, and exercise with her.  She is willing to proceed with lifestyle modification.

## 2020-03-27 NOTE — Progress Notes (Signed)
   CC: Abnormal lab  HPI:  Kathryn Cannon is a 49 y.o. female with PMHx as documented below, was sent to clinic from [facility] for abnormal lab (lipid panel). Please refer to problem based charting for further details and assessment and plan of current problem and chronic medical conditions.   Past Medical History:  Diagnosis Date  . Atypical ductal hyperplasia of left breast   . Headache   . Hyperlipidemia    Review of Systems:   Review of Systems  Constitutional: Negative for chills and fever.  Cardiovascular: Negative for chest pain, palpitations and leg swelling.  Neurological: Positive for headaches. Negative for dizziness.   Physical Exam:  Vitals:   03/27/20 1323  BP: (!) 119/47  Pulse: 75  Temp: 98.2 F (36.8 C)  TempSrc: Oral  SpO2: 98%  Weight: 143 lb 6.4 oz (65 kg)   Constitutional: Well-developed and well-nourished. No acute distress.  HENT:  Head: Normocephalic and atraumatic.  Eyes: Conjunctivae are normal, EOM nl Cardiovascular:  RRR, nl S1S2, no murmur,  no LEE Respiratory: Effort normal and breath sounds normal. No respiratory distress. No wheezes.  OA:CZYS. Bowel sounds are normal. No distension. There is no tenderness.  Neurological: Is alert and oriented x 3 Skin: Not diaphoretic. No erythema.    Assessment & Plan:   See Encounters Tab for problem based charting.  Patient discussed with Dr. Sandre Kitty

## 2020-03-27 NOTE — Assessment & Plan Note (Addendum)
The patient's blood pressure today is normal at 119/47.  She mentions that she checks her blood pressure at home and states usually around 120/70.  She is not on any hypertensive medication. No further management at this point.

## 2020-03-28 NOTE — Progress Notes (Signed)
Internal Medicine Clinic Attending  Case discussed with Dr. Masoudi at the time of the visit.  We reviewed the resident's history and exam and pertinent patient test results.  I agree with the assessment, diagnosis, and plan of care documented in the resident's note.  Khiana Camino, M.D., Ph.D.  

## 2020-07-03 ENCOUNTER — Other Ambulatory Visit: Payer: Self-pay | Admitting: *Deleted

## 2020-07-03 DIAGNOSIS — E781 Pure hyperglyceridemia: Secondary | ICD-10-CM

## 2020-07-04 MED ORDER — FENOFIBRATE 48 MG PO TABS: 48.0000 mg | ORAL_TABLET | Freq: Every day | ORAL | 1 refills | Status: DC

## 2020-09-01 NOTE — Progress Notes (Signed)
[facility]  [phone] [phone]     ID: Kathryn Cannon DOB: May 21, 1971  MR#: [id number]  VPX#:[id number]  Patient Care Team: [place], Sarah T, MD as PCP - General (Family Medicine) Gustabo Gordillo, Valentino Hue, MD as Consulting Physician (Oncology) Griselda Miner, MD as Consulting Physician (General Surgery) Chevis Pretty, MD as Resident (Internal Medicine) OTHER MD:  CHIEF COMPLAINT: Atypical ductal hyperplasia  CURRENT TREATMENT:  tamoxifen (10 mg)   INTERVAL HISTORY: [name] is here today for f/u of her atypical ductal hyperplasia and her increased risk for developing breast cancer.  She is accompanied by an interpreter  She continues on Tamoxifen at 10mg  daily.  She has noted quite a hot flash some nights around 2 AM which does wake her up.  She does not have hot flashes during the day.  She is having periods every 1 to 2 months.  They are otherwise unremarkable  Since her last visit, she underwent bilateral diagnostic mammography with tomography at [facility] on 03/06/2020 showing: breast density category C; no evidence of malignancy in either breast.   Of note, she also underwent pap smear on 03/06/2020, which was negative.   REVIEW OF SYSTEMS: [name] had a completely benign review of systems today   COVID 19 VACCINATION STATUS: Refuses vaccinations   HISTORY OF CURRENT ILLNESS: From the original intake note:  The patient had screening mammography Dec 28, 2016 showing a possible change in the right breast.  On 01/12/2017 she had right diagnostic mammography with tomography and right breast ultrasonography at [facility] in [place].  This showed the breast density to be category C.  In the upper inner right breast there was an area of persistent distortion.  However there was no ultrasound correlate.  The right axilla was sonographically benign.   On 01/27/2017 the patient attempted right breast biopsy under tomo  guidance but the abnormality could not be located so she proceeded to bilateral breast MRI February 14, 2017.  In the right breast there was no MRI correlate for the possible area of architectural distortion.  In the left breast however there was an oval circumscribed mass in the upper inner quadrant measuring 0.7 cm.  There was a second focal area of non-mass-like enhancement in the upper outer quadrant of the left breast measuring 0.8 cm.  There were no abnormal-appearing lymph nodes.  Biopsy of the 2 left breast areas in question was performed March 02, 2017 and showed (SAA March 04, 2017) fibrocystic change in the more outer biopsy and atypical ductal hyperplasia in the more inner located biopsy.  The patient then proceeded to left lumpectomy on April 27, 2017.  This showed (SZA [phone]) fibrocystic changes, but no evidence of malignancy.  She was referred to the high risk clinic for further evaluation  The patient's subsequent history is as detailed below.   PAST MEDICAL HISTORY: Past Medical History:  Diagnosis Date  . Atypical ductal hyperplasia of left breast   . Headache   . Hyperlipidemia     PAST SURGICAL HISTORY: Past Surgical History:  Procedure Laterality Date  . BREAST EXCISIONAL BIOPSY    . BREAST LUMPECTOMY WITH RADIOACTIVE SEED LOCALIZATION Left 04/27/2017   Procedure: RADIOCATIVE SEED GUIDED LEFT BREAST LUMPECTOMY, ERAS PATHWAY;  Surgeon: 06/27/2017, MD;  Location: [facility] OR;  Service: General;  Laterality: Left;  . BREAST SURGERY    . CESAREAN SECTION     3 previous    FAMILY HISTORY Family History  Problem Relation Age of Onset  .  Diabetes Paternal Aunt   As of October 2018 the patient's father is 95 years old and her mother 48 years old.  The patient has 6 brothers and 3 sisters.  There is no history of cancer in the family or the extended family to the best of her knowledge   GYNECOLOGIC HISTORY:  No LMP recorded. Menarche age 50, first live birth age 18, she is GX  P3.  She is still having regular periods, which usually last 48 days, 1 of which is heavy   SOCIAL HISTORY: (Updated January 2022) She is originally from [place] city as is her husband. They have been in this area more than 20 years.  She is a housewife.  Her husband is a Curator. Their 3 sons are 58 and he works for [employer] in [place], her 48 year old studies Tourism and lives currently with the patient as does, her 48 year old.    ADVANCED DIRECTIVES: In the absence of any documentation to the contrary, the patient's spouse is their HCPOA.    HEALTH MAINTENANCE: Social History   Tobacco Use  . Smoking status: Never Smoker  . Smokeless tobacco: Never Used  Vaping Use  . Vaping Use: Never used  Substance Use Topics  . Alcohol use: No  . Drug use: No     Colonoscopy:  PAP: 02/2020, negative  Bone density:   No Known Allergies  Current Outpatient Medications  Medication Sig Dispense Refill  . fenofibrate (TRICOR) 48 MG tablet Take 1 tablet (48 mg total) by mouth daily. 90 tablet 1  . Magnesium Oxide 500 MG CAPS Take 500 mg by mouth daily.    . tamoxifen (NOLVADEX) 10 MG tablet Take 1 tablet (10 mg total) by mouth 2 (two) times daily. 90 tablet 4   No current facility-administered medications for this visit.    OBJECTIVE: White woman in no acute distress Vitals:   09/02/20 1121  BP: (!) 145/64  Pulse: (!) 53  Resp: 18  Temp: 97.9 F (36.6 C)  SpO2: 99%     Body mass index is 27.17 kg/m.   Wt Readings from Last 3 Encounters:  09/02/20 143 lb 12.8 oz (65.2 kg)  03/27/20 143 lb 6.4 oz (65 kg)  03/17/20 139 lb 12.8 oz (63.4 kg)      ECOG FS:0 - Asymptomatic  Sclerae unicteric, EOMs intact Wearing a mask No cervical or supraclavicular adenopathy Lungs no rales or rhonchi Heart regular rate and rhythm Abd soft, nontender, positive bowel sounds MSK no focal spinal tenderness, no upper extremity lymphedema Neuro: nonfocal, well oriented, appropriate  affect Breasts: No suspicious masses in either breast, no skin or nipple changes of concern.  Both axillae are benign.   LAB RESULTS:  CMP     Component Value Date/Time   GLUCOSE 96 03/17/2020 1055   Lab Results  Component Value Date   WBC 4.2 09/02/2020   NEUTROABS 1.8 09/02/2020   HGB 12.9 09/02/2020   HCT 38.1 09/02/2020   MCV 87.2 09/02/2020   PLT 246 09/02/2020      Chemistry   No results found for: NA, K, CL, CO2, BUN, CREATININE, GLU No results found for: CALCIUM, ALKPHOS, AST, ALT, BILITOT    No results found for: LABCA2  No components found for: KZLDJT701  No results for input(s): INR in the last 168 hours.  No results found for: LABCA2  No results found for: XBL390  No results found for: ZES923  No results found for: RAQ762  No results found for: CA2729  No components found for: HGQUANT  No results found for: CEA1 / No results found for: CEA1   No results found for: AFPTUMOR  No results found for: CHROMOGRNA  No results found for: TOTALPROTELP, ALBUMINELP, A1GS, A2GS, BETS, BETA2SER, GAMS, MSPIKE, SPEI (this displays SPEP labs)  No results found for: KPAFRELGTCHN, LAMBDASER, KAPLAMBRATIO (kappa/lambda light chains)  No results found for: HGBA, HGBA2QUANT, HGBFQUANT, HGBSQUAN (Hemoglobinopathy evaluation)   No results found for: LDH  No results found for: IRON, TIBC, IRONPCTSAT (Iron and TIBC)  No results found for: FERRITIN  Urinalysis No results found for: COLORURINE, APPEARANCEUR, LABSPEC, PHURINE, GLUCOSEU, HGBUR, BILIRUBINUR, KETONESUR, PROTEINUR, UROBILINOGEN, NITRITE, LEUKOCYTESUR   STUDIES: No results found.   ELIGIBLE FOR AVAILABLE RESEARCH PROTOCOL: no  ASSESSMENT: 50 y.o. Spanish speaker from [place], [place] s/p left breast upper inner quadrant biopsy March 02, 2017 showing atypical ductal hyperplasia  (1) left lumpectomy April 27, 2017 showed only fibrocystic changes, no evidence of malignancy.  (2) tamoxifen started  June 13, 2017  (a) patient is premenopausal  (b) dose reduced to 10 mg daily as of January 2019 because of side effects   PLAN: Junell is now 3-1/2 years into her planned 5 years of tamoxifen, at 10 mg daily.  She is tolerating this well.  Hot flashes so far are very minimal and I do not think we need to add gabapentin at bedtime to help her sleep through the night though that remains a possibility if the hot flashes worsen.  She is still premenopausal.  She will see Dr. Gerre Pebbles for her lipid checkup and she follows with the [facility] in [place] as needed.  She is going to see [place] again in October of this year and then we will see her 1 last time October of next year  Total encounter time 20 minutes.*.   Valentino Hue. Thursa Emme, MD  09/02/20 11:52 AM Medical Oncology and Hematology [facility] [address] [place], [place] [address] Tel. [phone]    Fax. [phone]    I, Mickie Bail, am acting as scribe for Dr. Valentino Hue. Gilliam Hawkes.  I, Ruthann Cancer MD, have reviewed the above documentation for accuracy and completeness, and I agree with the above.    *Total Encounter Time as defined by the Centers for Medicare and Medicaid Services includes, in addition to the face-to-face time of a patient visit (documented in the note above) non-face-to-face time: obtaining and reviewing outside history, ordering and reviewing medications, tests or procedures, care coordination (communications with other health care professionals or caregivers) and documentation in the medical record.

## 2020-09-02 ENCOUNTER — Inpatient Hospital Stay: Payer: No Typology Code available for payment source | Attending: Oncology

## 2020-09-02 ENCOUNTER — Inpatient Hospital Stay (HOSPITAL_BASED_OUTPATIENT_CLINIC_OR_DEPARTMENT_OTHER): Payer: No Typology Code available for payment source | Admitting: Oncology

## 2020-09-02 ENCOUNTER — Other Ambulatory Visit: Payer: Self-pay

## 2020-09-02 VITALS — BP 145/64 | HR 53 | Temp 97.9°F | Resp 18 | Ht 61.0 in | Wt 143.8 lb

## 2020-09-02 DIAGNOSIS — N6092 Unspecified benign mammary dysplasia of left breast: Secondary | ICD-10-CM | POA: Insufficient documentation

## 2020-09-02 DIAGNOSIS — E785 Hyperlipidemia, unspecified: Secondary | ICD-10-CM | POA: Insufficient documentation

## 2020-09-02 DIAGNOSIS — Z833 Family history of diabetes mellitus: Secondary | ICD-10-CM | POA: Insufficient documentation

## 2020-09-02 DIAGNOSIS — Z79810 Long term (current) use of selective estrogen receptor modulators (SERMs): Secondary | ICD-10-CM | POA: Insufficient documentation

## 2020-09-02 DIAGNOSIS — Z79899 Other long term (current) drug therapy: Secondary | ICD-10-CM | POA: Insufficient documentation

## 2020-09-02 LAB — CBC WITH DIFFERENTIAL (CANCER CENTER ONLY)
Abs Immature Granulocytes: 0 10*3/uL (ref 0.00–0.07)
Basophils Absolute: 0.1 10*3/uL (ref 0.0–0.1)
Basophils Relative: 1 %
Eosinophils Absolute: 0.1 10*3/uL (ref 0.0–0.5)
Eosinophils Relative: 3 %
HCT: 38.1 % (ref 36.0–46.0)
Hemoglobin: 12.9 g/dL (ref 12.0–15.0)
Immature Granulocytes: 0 %
Lymphocytes Relative: 45 %
Lymphs Abs: 1.9 10*3/uL (ref 0.7–4.0)
MCH: 29.5 pg (ref 26.0–34.0)
MCHC: 33.9 g/dL (ref 30.0–36.0)
MCV: 87.2 fL (ref 80.0–100.0)
Monocytes Absolute: 0.4 10*3/uL (ref 0.1–1.0)
Monocytes Relative: 9 %
Neutro Abs: 1.8 10*3/uL (ref 1.7–7.7)
Neutrophils Relative %: 42 %
Platelet Count: 246 10*3/uL (ref 150–400)
RBC: 4.37 MIL/uL (ref 3.87–5.11)
RDW: 12.8 % (ref 11.5–15.5)
WBC Count: 4.2 10*3/uL (ref 4.0–10.5)
nRBC: 0 % (ref 0.0–0.2)

## 2020-09-02 LAB — CMP (CANCER CENTER ONLY)
ALT: 18 U/L (ref 0–44)
AST: 23 U/L (ref 15–41)
Albumin: 4.2 g/dL (ref 3.5–5.0)
Alkaline Phosphatase: 29 U/L — ABNORMAL LOW (ref 38–126)
Anion gap: 6 (ref 5–15)
BUN: 14 mg/dL (ref 6–20)
CO2: 28 mmol/L (ref 22–32)
Calcium: 9.5 mg/dL (ref 8.9–10.3)
Chloride: 108 mmol/L (ref 98–111)
Creatinine: 0.59 mg/dL (ref 0.44–1.00)
GFR, Estimated: >60 mL/min (ref >60)
Glucose, Bld: 103 mg/dL — ABNORMAL HIGH (ref 70–99)
Potassium: 4.3 mmol/L (ref 3.5–5.1)
Sodium: 142 mmol/L (ref 135–145)
Total Bilirubin: 0.3 mg/dL (ref 0.3–1.2)
Total Protein: 7.6 g/dL (ref 6.5–8.1)

## 2020-09-02 MED ORDER — TAMOXIFEN CITRATE 10 MG PO TABS: 10.0000 mg | ORAL_TABLET | Freq: Two times a day (BID) | ORAL | 4 refills | Status: DC

## 2020-09-18 ENCOUNTER — Ambulatory Visit (INDEPENDENT_AMBULATORY_CARE_PROVIDER_SITE_OTHER): Payer: Self-pay | Admitting: Internal Medicine

## 2020-09-18 ENCOUNTER — Encounter: Payer: Self-pay | Admitting: Internal Medicine

## 2020-09-18 ENCOUNTER — Other Ambulatory Visit: Payer: Self-pay

## 2020-09-18 VITALS — BP 127/80 | HR 85 | Temp 97.8°F | Ht 63.0 in | Wt 144.4 lb

## 2020-09-18 DIAGNOSIS — E785 Hyperlipidemia, unspecified: Secondary | ICD-10-CM

## 2020-09-18 DIAGNOSIS — R7303 Prediabetes: Secondary | ICD-10-CM

## 2020-09-18 LAB — POCT GLYCOSYLATED HEMOGLOBIN (HGB A1C): Hemoglobin A1C: 6.1 % — AB (ref 4.0–5.6)

## 2020-09-18 LAB — GLUCOSE, CAPILLARY: Glucose-Capillary: 87 mg/dL (ref 70–99)

## 2020-09-18 NOTE — Progress Notes (Signed)
Established Patient Office Visit  Subjective:  Patient ID: Kathryn Cannon, female    DOB: 02/22/1971  Age: 50 y.o. MRN: [id number]  CC:  Chief Complaint  Patient presents with  . Follow-up    FOLLOW UP TO CHECK CHOLESTEROL MEDICATION REFILL   HPI Kathryn Cannon presents for f/u of hyper TG. Language translator is in the room and assist with translation for obtaining history. Please refer to problem base charting for further details and assessment and plan.  PMHx: atypical ductal hyperplasia  Medications:Tomoxifen, Mg, fenofibrate 48 mg  Past Medical History:  Diagnosis Date  . Atypical ductal hyperplasia of left breast   . Headache   . Hyperlipidemia     Past Surgical History:  Procedure Laterality Date  . BREAST EXCISIONAL BIOPSY    . BREAST LUMPECTOMY WITH RADIOACTIVE SEED LOCALIZATION Left 04/27/2017   Procedure: RADIOCATIVE SEED GUIDED LEFT BREAST LUMPECTOMY, ERAS PATHWAY;  Surgeon: Griselda Miner, MD;  Location: [facility] OR;  Service: General;  Laterality: Left;  . BREAST SURGERY    . CESAREAN SECTION     3 previous    Family History  Problem Relation Age of Onset  . Diabetes Paternal Aunt     Social History   Socioeconomic History  . Marital status: Married    Spouse name: Not on file  . Number of children: 3  . Years of education: Not on file  . Highest education level: 7th grade  Occupational History  . Not on file  Tobacco Use  . Smoking status: Never Smoker  . Smokeless tobacco: Never Used  Vaping Use  . Vaping Use: Never used  Substance and Sexual Activity  . Alcohol use: No  . Drug use: No  . Sexual activity: Yes    Birth control/protection: Condom  Other Topics Concern  . Not on file  Social History Narrative  . Not on file   Social Determinants of Health   Financial Resource Strain: Not on file  Food Insecurity: Not on file  Transportation Needs: No Transportation Needs  . Lack of Transportation (Medical): No  . Lack of  Transportation (Non-Medical): No  Physical Activity: Not on file  Stress: Not on file  Social Connections: Not on file  Intimate Partner Violence: Not on file    Outpatient Medications Prior to Visit  Medication Sig Dispense Refill  . fenofibrate (TRICOR) 48 MG tablet Take 1 tablet (48 mg total) by mouth daily. 90 tablet 1  . Magnesium Oxide 500 MG CAPS Take 500 mg by mouth daily.    . tamoxifen (NOLVADEX) 10 MG tablet Take 1 tablet (10 mg total) by mouth 2 (two) times daily. 90 tablet 4   No facility-administered medications prior to visit.    No Known Allergies  ROS Review of Systems  Constitutional: Negative for chills and fever.      Objective:    Physical Exam Constitutional:      Appearance: Normal appearance. She is not ill-appearing.  HENT:     Head: Normocephalic and atraumatic.  Cardiovascular:     Rate and Rhythm: Normal rate and regular rhythm.     Heart sounds: No murmur heard.   Pulmonary:     Effort: Pulmonary effort is normal.     Breath sounds: Normal breath sounds. No wheezing or rales.  Abdominal:     Palpations: Abdomen is soft.     Tenderness: There is no abdominal tenderness.  Musculoskeletal:     Right lower leg: No edema.  Left lower leg: No edema.  Skin:    General: Skin is warm and dry.  Neurological:     Mental Status: She is alert and oriented to person, place, and time.  Psychiatric:        Mood and Affect: Mood normal.        Behavior: Behavior normal.        Thought Content: Thought content normal.        Judgment: Judgment normal.     BP 127/80 (BP Location: Right Arm, Patient Position: Sitting, Cuff Size: Normal)   Pulse 85   Temp 97.8 F (36.6 C) (Oral)   Ht 5\' 3"  (1.6 m)   Wt 144 lb 6.4 oz (65.5 kg)   SpO2 99%   BMI 25.58 kg/m  Wt Readings from Last 3 Encounters:  09/18/20 144 lb 6.4 oz (65.5 kg)  09/02/20 143 lb 12.8 oz (65.2 kg)  03/27/20 143 lb 6.4 oz (65 kg)     Health Maintenance Due  Topic Date Due   . Hepatitis C Screening  Never done  . HIV Screening  Never done  . TETANUS/TDAP  Never done  . COLONOSCOPY (Pts 45-11yrs Insurance coverage will need to be confirmed)  Never done  . INFLUENZA VACCINE  Never done    There are no preventive care reminders to display for this patient.  No results found for: TSH Lab Results  Component Value Date   WBC 4.2 09/02/2020   HGB 12.9 09/02/2020   HCT 38.1 09/02/2020   MCV 87.2 09/02/2020   PLT 246 09/02/2020   Lab Results  Component Value Date   NA 142 09/02/2020   K 4.3 09/02/2020   CO2 28 09/02/2020   GLUCOSE 103 (H) 09/02/2020   BUN 14 09/02/2020   CREATININE 0.59 09/02/2020   BILITOT 0.3 09/02/2020   ALKPHOS 29 (L) 09/02/2020   AST 23 09/02/2020   ALT 18 09/02/2020   PROT 7.6 09/02/2020   ALBUMIN 4.2 09/02/2020   CALCIUM 9.5 09/02/2020   ANIONGAP 6 09/02/2020   Lab Results  Component Value Date   CHOL 245 (H) 03/17/2020   Lab Results  Component Value Date   HDL 38 (L) 03/17/2020   Lab Results  Component Value Date   LDLCALC 149 (H) 03/17/2020   Lab Results  Component Value Date   TRIG 315 (H) 03/17/2020   Lab Results  Component Value Date   CHOLHDL 6.4 (H) 03/17/2020   Lab Results  Component Value Date   HGBA1C 5.8 (H) 03/17/2020      Assessment & Plan:   Problem List Items Addressed This Visit      Other   Hyperlipidemia - Primary    She was started on fenofibrate less than 2 years ago in another facility ([facility]) for triglyceride of 752.  TG last visit was 315 with low ASCVD risk score and TG level <800, will not necessarily located for triglyceride lowering agent.   -ASCVD risk score is low 10-year risk of 1.9 %.  No statin recommended because 10-year risk <5% -Lipid panel today. Will discontinue fenofibrate 48 mg daily if TG remains at previous level. -Follow-up in clinic in a year        Relevant Orders   Lipid Profile   Pre-diabetes    Checking HbA1c today.      Relevant Orders    POC Hbg A1C      No orders of the defined types were placed in this encounter.  Follow-up: No follow-ups on file.    Chevis Pretty, MD

## 2020-09-18 NOTE — Assessment & Plan Note (Addendum)
Checking HbA1c today.  ADDENDUM: Hb A1c 6.1. Consitent with prediabetes. Discussed starting Low dose metformin vs life style modification with patient. She prefers life style modification.

## 2020-09-18 NOTE — Progress Notes (Signed)
Internal Medicine Clinic Attending  Case discussed with Dr. Masoudi  At the time of the visit.  We reviewed the resident's history and exam and pertinent patient test results.  I agree with the assessment, diagnosis, and plan of care documented in the resident's note.  

## 2020-09-18 NOTE — Patient Instructions (Addendum)
Gracias por permitirnos brindarle su atencin hoy. Hoy repetimos su prueba de colesterol y diabetes y le llamaremos sobre el plan cuando regrese el laboratorio.  Hasta entonces, tome sus medicamentos como antes.  Vuelva a la clnica en 9 a 12 meses para un seguimiento o antes, segn sea necesario. Si tiene alguna pregunta o inquietud, llame a la clnica de medicina interna al [phone].  Gracias!   Thank you for allowing [place] to provide your care today. Today we repeat your cholesterol and diabetes test and will call you about the plan when the lab comes back.  Until then, please take your medications as before.   Please come back to clinic in 9-12 months for follow up of sooner as needed. Should you have any questions or concerns please call the internal medicine clinic at [phone].    Thank you!

## 2020-09-18 NOTE — Assessment & Plan Note (Signed)
She was started on fenofibrate less than 2 years ago in another facility ([facility]) for triglyceride of 752.  TG last visit was 315 with low ASCVD risk score and TG level <800, will not necessarily located for triglyceride lowering agent.   -ASCVD risk score is low 10-year risk of 1.9 %.  No statin recommended because 10-year risk <5% -Lipid panel today. Will discontinue fenofibrate 48 mg daily if TG remains at previous level. -Follow-up in clinic in a year   ADDENDUM:  Lipid Panel resulted as bellow:    Component Value Date/Time   CHOL 204 (H) 09/18/2020 1126   TRIG 185 (H) 09/18/2020 1126   HDL 40 09/18/2020 1126   CHOLHDL 5.1 (H) 09/18/2020 1126   LDLCALC 131 (H) 09/18/2020 1126   LABVLDL 33 09/18/2020 1126   -Discontinue Fenofibrate -Contacted patient with a Nurse, learning disability and informed her about the result and the plan.

## 2020-09-19 LAB — LIPID PANEL
Chol/HDL Ratio: 5.1 ratio — ABNORMAL HIGH (ref 0.0–4.4)
Cholesterol, Total: 204 mg/dL — ABNORMAL HIGH (ref 100–199)
HDL: 40 mg/dL (ref >39)
LDL Chol Calc (NIH): 131 mg/dL — ABNORMAL HIGH (ref 0–99)
Triglycerides: 185 mg/dL — ABNORMAL HIGH (ref 0–149)
VLDL Cholesterol Cal: 33 mg/dL (ref 5–40)

## 2020-10-02 ENCOUNTER — Ambulatory Visit: Payer: Self-pay

## 2020-12-31 ENCOUNTER — Other Ambulatory Visit: Payer: Self-pay

## 2020-12-31 ENCOUNTER — Telehealth: Payer: Self-pay

## 2020-12-31 DIAGNOSIS — Z1231 Encounter for screening mammogram for malignant neoplasm of breast: Secondary | ICD-10-CM

## 2020-12-31 NOTE — Telephone Encounter (Signed)
Health Coaching 3  interpreter- [name], [facility]   Goals- Patient has been walking for 1 hour daily.  Patient has been increasing the amount of time that she has been walking and for how often. Patient has been working on Engineer, materials for diet as well.    New goal- Continue working on trying to exercise daily.   Barrier to reaching goal- NA   Strategies to overcome- NA   Navigation:  Patient is aware of a follow up session. Patient is scheduled for follow-up appointment on January 26, 2021 @ 10:00 am.   Time- 10 minutes

## 2021-01-26 ENCOUNTER — Inpatient Hospital Stay: Payer: Self-pay | Attending: Obstetrics and Gynecology | Admitting: *Deleted

## 2021-01-26 ENCOUNTER — Other Ambulatory Visit: Payer: Self-pay

## 2021-01-26 VITALS — BP 138/90 | Ht 61.0 in | Wt 143.8 lb

## 2021-01-26 DIAGNOSIS — Z0000 Encounter for general adult medical examination without abnormal findings: Secondary | ICD-10-CM

## 2021-01-26 NOTE — Progress Notes (Signed)
[facility] follow up   Interpreter: Gerarda Gunther  Clinical Measurement:   Vitals:   01/26/21 1025 01/26/21 1030  BP: 140/90 138/90      Medical History:  Patient states that she does not know if she has high cholesterol, does not have high blood pressure and she does not have diabetes.  Medications:  Patient states that she does not take medication to lower cholesterol, blood pressure and blood sugar.  Patient does not take an aspirin a day to help prevent a heart attack or stroke.    Blood pressure, self measurement: Patient states that she does measure blood pressure from home. She checks her blood pressure weekly. She shares her readings with a health care provider: no.   Nutrition: Patient states that on average she eats 2 cups of fruit and 3 cups of vegetables per day. Patient states that she does eat fish at least 2 times per week. Patient eats more than half servings of whole grains. Patient drinks less than 36 ounces of beverages with added sugar weekly: yes. Patient is currently watching sodium or salt intake: yes. In the past 7 days patient has had 0 drinks containing alcohol. On average patient drinks 0 drinks containing alcohol per day.      Physical activity:  Patient states that she gets 180 minutes of moderate and 0 minutes of vigorous physical activity each week.  Smoking status:  Patient states that she has has never smoked .   Quality of life:  Over the past 2 weeks patient states that she had little interest or pleasure in doing things: not at all. She has been feeling down, depressed or hopeless:not at all.    Risk reduction and counseling:   Health Coaching: Spoke with patient about continuing to work on a heart healthy diet. Patient's cholesterol levels have come down since screening visit in August 2022. Patient has been adding more fruits and vegetables in diet. She has been consuming heart healthy fish at least twice a week and has increased her whole grain intake.  Encouraged patient to continue working on reducing the amount of sweets and sugars consumed as well as carbs. Explained to patient about the daily recommendations for the carb/grain group. Encouraged patient to continue with exercise with a goal of 20-30 minutes per week.    Navigation: This was the  follow up session for this patient, I will check up on her progress in the coming months. Patient had blood work rechecked again in Feb. 2022 with Internal Medicine and will be eligible for re-screening again in Feb. 2023.  Time: 20 minutes

## 2021-03-12 ENCOUNTER — Ambulatory Visit: Payer: Self-pay | Admitting: *Deleted

## 2021-03-12 ENCOUNTER — Other Ambulatory Visit: Payer: Self-pay

## 2021-03-12 ENCOUNTER — Ambulatory Visit
Admission: RE | Admit: 2021-03-12 | Discharge: 2021-03-12 | Disposition: A | Discharge status: Home / Self Care | Payer: No Typology Code available for payment source | Source: Ambulatory Visit | Attending: Obstetrics and Gynecology | Admitting: Obstetrics and Gynecology

## 2021-03-12 VITALS — BP 130/84 | Wt 141.9 lb

## 2021-03-12 DIAGNOSIS — Z1239 Encounter for other screening for malignant neoplasm of breast: Secondary | ICD-10-CM

## 2021-03-12 NOTE — Progress Notes (Signed)
[name] is a 50 y.o. female who presents to [facility] clinic today with no complaints. 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   Pap Smear: Pap smear not completed today. Last Pap smear was 03/06/2020 at [facility] clinic and was normal with negative HPV. Per patient has no history of an abnormal Pap smear. Last Pap smear result is available in Epic.   Physical exam: Breasts Breasts symmetrical. No skin abnormalities bilateral breasts. No nipple retraction bilateral breasts. No nipple discharge bilateral breasts. No lymphadenopathy. No lumps palpated bilateral breasts. No complaints of pain or tenderness on exam. No complaints of pain or tenderness on exam.     MM Breast Surgical Specimen  Result Date: 04/27/2017 CLINICAL DATA:  Post left breast excision for recently diagnosed atypical ductal hyperplasia. EXAM: SPECIMEN RADIOGRAPH OF THE LEFT BREAST COMPARISON:  Previous exam(s). FINDINGS: Status post excision of the left breast. The radioactive seed and biopsy marker clip are present, completely intact, and were marked for pathology. IMPRESSION: Specimen radiograph of the left breast. Electronically Signed   By: Kathryn Cannon M.D.   On: 04/27/2017 09:05   MM DIAG BREAST TOMO BILATERAL  Result Date: 02/14/2018 CLINICAL DATA:  Patient presents for focal tenderness within the medial left breast. Patient had prior excisional biopsy at this site 04/2017. EXAM: DIGITAL DIAGNOSTIC BILATERAL MAMMOGRAM WITH CAD AND TOMO ULTRASOUND LEFT BREAST COMPARISON:  Previous exam(s). ACR Breast Density Category c: The breast tissue is heterogeneously dense, which may obscure small masses. FINDINGS: Post biopsy changes demonstrated within the medial left breast. No concerning masses, calcifications or distortion identified within either breast. Mammographic images were processed  with CAD. On physical exam, palpate no discrete mass within the medial left breast. Targeted ultrasound is performed, showing normal tissue within the left breast 10 o'clock position 2 cm from nipple at the site of focal tenderness. IMPRESSION: No mammographic evidence for malignancy. RECOMMENDATION: Continued clinical evaluation for reported left breast tenderness. Screening mammogram in one year.(Code:SM-B-01Y) I have discussed the findings and recommendations with the patient. Results were also provided in writing at the conclusion of the visit. If applicable, a reminder letter will be sent to the patient regarding the next appointment. BI-RADS CATEGORY  2: Benign. Electronically Signed   By: Kathryn Cannon M.D.   On: 02/14/2018 15:02   MS DIGITAL SCREENING TOMO BILATERAL  Result Date: 03/07/2020 CLINICAL DATA:  Screening. EXAM: DIGITAL SCREENING BILATERAL MAMMOGRAM WITH TOMO AND CAD COMPARISON:  Previous exam(s). ACR Breast Density Category c: The breast tissue is heterogeneously dense, which may obscure small masses. FINDINGS: There are no findings suspicious for malignancy. Images were processed with CAD. IMPRESSION: No mammographic evidence of malignancy. A result letter of this screening mammogram will be mailed directly to the patient. RECOMMENDATION: Screening mammogram in one year. (Code:SM-B-01Y) BI-RADS CATEGORY  1: Negative. Electronically Signed   By: Kathryn Cannon M.D.   On: 03/07/2020 11:35   MS DIGITAL SCREENING TOMO BILATERAL  Result Date: 03/02/2019 CLINICAL DATA:  Screening. EXAM: DIGITAL SCREENING BILATERAL MAMMOGRAM WITH TOMO AND CAD COMPARISON:  Previous exam(s). ACR Breast Density Category c: The breast tissue is heterogeneously dense, which may obscure small masses. FINDINGS: There are no findings suspicious for malignancy. Images were processed with CAD. IMPRESSION: No mammographic evidence of malignancy. A result letter of this screening mammogram will be mailed directly to the  patient. RECOMMENDATION: Screening mammogram in  one year. (Code:SM-B-01Y) BI-RADS CATEGORY  1: Negative. Electronically Signed   By: Kathryn Cannon M.D.   On: 03/02/2019 12:49   MM LT RADIOACTIVE SEED LOC MAMMO GUIDE  Result Date: 04/26/2017 CLINICAL DATA:  50 year old female for localization of left breast ADH prior to excision. EXAM: MAMMOGRAPHIC GUIDED RADIOACTIVE SEED LOCALIZATION OF THE LEFT BREAST COMPARISON:  Previous exam(s). FINDINGS: Patient presents for radioactive seed localization prior to left breast excision. I met with the patient and we discussed the procedure of seed localization including benefits and alternatives. We discussed the high likelihood of a successful procedure. We discussed the risks of the procedure including infection, bleeding, tissue injury and further surgery. We discussed the low dose of radioactivity involved in the procedure. Informed, written consent was given. The usual time-out protocol was performed immediately prior to the procedure. Using mammographic guidance, sterile technique, 1% lidocaine and an I-125 radioactive seed, the cylinder-shaped biopsy clip within the inner left breast was localized using a superior approach. The follow-up mammogram images confirm the seed in the expected location and were marked for Kathryn Cannon. Follow-up survey of the patient confirms presence of the radioactive seed. Order number of I-125 seed:  [id number]. Total activity:  4.098 millicuries  Reference Date: 04/21/2017 The patient tolerated the procedure well and was released from the Kathryn Cannon. She was given instructions regarding seed removal. IMPRESSION: Radioactive seed localization left breast. No apparent complications. Electronically Signed   By: Kathryn Cannon M.D.   On: 04/26/2017 14:09   MM CLIP PLACEMENT LEFT  Result Date: 03/02/2017 CLINICAL DATA:  Post MRI guided biopsy of an area of enhancement in the far outer left breast and an area of enhancement in the inner  left breast. EXAM: DIAGNOSTIC LEFT MAMMOGRAM POST MRI BIOPSY COMPARISON:  Previous exam(s). FINDINGS: Mammographic images were obtained following MRI guided biopsy of an area enhancement in the far outer left breast and also of an area of enhancement in the inner left breast. A dumbbell shaped biopsy marking clip is present at the site of biopsy in the far outer left breast. A cylindrical shaped biopsy marking clip is present at the site of biopsy in the inner left breast. IMPRESSION: 1. Dumbbell shaped biopsy marking clip at site of biopsied area of enhancement in the far outer left breast. 2. Cylindrical shaped biopsy marking clip at site of biopsied area of enhancement in the inner left breast. Final Assessment: Post Procedure Mammograms for Marker Placement Electronically Signed   By: Kathryn Cannon M.D.   On: 03/02/2017 09:38   MM RT BREAST BX W LOC DEV 1ST LESION IMAGE BX SPEC STEREO GUIDE  Result Date: 01/27/2017 : The patient presented today for tomosynthesis guided biopsy of an area of distortion identified within the posterior, superior right breast on recent diagnostic mammogram from Jan 13, 2017. Despite multiple scout tomosynthesis images being obtained, the area was unable to be definitively identified to allow targeting for biopsy. Full paddle MLO and rolled MLO views were then performed with tomosynthesis. On the additional views, the questioned area of distortion on recent diagnostic mammogram is thought to have been secondary to overlapping tissue. No definite area of distortion is identified on today's views. A bilateral breast MRI is recommended for further evaluation. If no suspicious enhancement is seen in this area on breast MRI, then this area would be considered probably benign and a right diagnostic mammogram with tomosynthesis recommended in 6 months. Recommendations were discussed with the patient through the use of an interpreter.  The patient will be scheduled for breast MRI. There  should be no charges for today's visit. Electronically Signed   By: Pamelia Hoit M.D.   On: 01/27/2017 12:20     Pelvic/Bimanual Pap is not indicated today per BCCCP guidelines.   Smoking History: Patient has never smoked.   Patient Navigation: Patient education provided. Access to services provided for patient through [place] program. Spanish interpreter Rudene Anda from [facility] provided.   Colorectal Cancer Screening: Per patient has never had colonoscopy completed. No complaints today.    Breast and Cervical Cancer Risk Assessment: Patient does not have family history of breast cancer, known genetic mutations, or radiation treatment to the chest before age 59. Patient does not have history of cervical dysplasia, immunocompromised, or DES exposure in-utero.  Risk Assessment     Risk Scores       03/12/2021 03/06/2020   Last edited by: Demetrius Revel, LPN McGill, Sherie Mamie Nick, LPN   5-year risk: 1.1 % 1.1 %   Lifetime risk: 9.8 % 10 %           A: BCCCP exam without pap smear No complaints.  P: Referred patient to the [facility] for a screening mammogram on the mobile unit. Appointment scheduled Thursday, March 12, 2021 at 0900.  Loletta Parish, RN 03/12/2021 8:35 AM

## 2021-03-12 NOTE — Patient Instructions (Signed)
Explained breast self awareness with [name]. Patient did not need a Pap smear today due to last Pap smear and HPV typing was 03/06/2020. Let her know BCCCP will cover Pap smears and HPV typing every 5 years unless has a history of abnormal Pap smears. [facility]erred [facility] to [facility] for a screening mammogram on the mobile unit. Appointment scheduled Thursday, March 12, 2021 at 0900. Patient escorted to the mobile unit following BCCCP appointment for her screening mammogram. Let patient know the Breast Center will follow up with her within the next couple weeks with results of her mammogram by letter or phone. Kathryn Cannon verbalized understanding.  Nayden Czajka, Kathaleen Maser, RN 8:35 AM

## 2021-05-26 ENCOUNTER — Other Ambulatory Visit: Payer: Self-pay | Admitting: *Deleted

## 2021-05-26 DIAGNOSIS — N6092 Unspecified benign mammary dysplasia of left breast: Secondary | ICD-10-CM

## 2021-05-27 ENCOUNTER — Encounter: Payer: Self-pay | Admitting: Adult Health

## 2021-05-27 ENCOUNTER — Inpatient Hospital Stay (HOSPITAL_BASED_OUTPATIENT_CLINIC_OR_DEPARTMENT_OTHER): Payer: Self-pay | Admitting: Adult Health

## 2021-05-27 ENCOUNTER — Other Ambulatory Visit: Payer: Self-pay

## 2021-05-27 ENCOUNTER — Inpatient Hospital Stay: Payer: No Typology Code available for payment source | Attending: Adult Health

## 2021-05-27 VITALS — BP 123/60 | HR 66 | Temp 98.4°F | Resp 16 | Ht 61.0 in | Wt 143.7 lb

## 2021-05-27 DIAGNOSIS — N6092 Unspecified benign mammary dysplasia of left breast: Secondary | ICD-10-CM

## 2021-05-27 DIAGNOSIS — Z79810 Long term (current) use of selective estrogen receptor modulators (SERMs): Secondary | ICD-10-CM | POA: Insufficient documentation

## 2021-05-27 DIAGNOSIS — N62 Hypertrophy of breast: Secondary | ICD-10-CM | POA: Insufficient documentation

## 2021-05-27 LAB — CMP (CANCER CENTER ONLY)
ALT: 23 U/L (ref 0–44)
AST: 27 U/L (ref 15–41)
Albumin: 4.2 g/dL (ref 3.5–5.0)
Alkaline Phosphatase: 30 U/L — ABNORMAL LOW (ref 38–126)
Anion gap: 6 (ref 5–15)
BUN: 18 mg/dL (ref 6–20)
CO2: 24 mmol/L (ref 22–32)
Calcium: 9.8 mg/dL (ref 8.9–10.3)
Chloride: 112 mmol/L — ABNORMAL HIGH (ref 98–111)
Creatinine: 0.67 mg/dL (ref 0.44–1.00)
GFR, Estimated: >60 mL/min (ref >60)
Glucose, Bld: 90 mg/dL (ref 70–99)
Potassium: 4.2 mmol/L (ref 3.5–5.1)
Sodium: 142 mmol/L (ref 135–145)
Total Bilirubin: 0.4 mg/dL (ref 0.3–1.2)
Total Protein: 7.1 g/dL (ref 6.5–8.1)

## 2021-05-27 LAB — CBC WITH DIFFERENTIAL (CANCER CENTER ONLY)
Abs Immature Granulocytes: 0.01 10*3/uL (ref 0.00–0.07)
Basophils Absolute: 0 10*3/uL (ref 0.0–0.1)
Basophils Relative: 1 %
Eosinophils Absolute: 0.1 10*3/uL (ref 0.0–0.5)
Eosinophils Relative: 2 %
HCT: 39.9 % (ref 36.0–46.0)
Hemoglobin: 13.6 g/dL (ref 12.0–15.0)
Immature Granulocytes: 0 %
Lymphocytes Relative: 37 %
Lymphs Abs: 2.2 10*3/uL (ref 0.7–4.0)
MCH: 30.6 pg (ref 26.0–34.0)
MCHC: 34.1 g/dL (ref 30.0–36.0)
MCV: 89.7 fL (ref 80.0–100.0)
Monocytes Absolute: 0.6 10*3/uL (ref 0.1–1.0)
Monocytes Relative: 10 %
Neutro Abs: 3 10*3/uL (ref 1.7–7.7)
Neutrophils Relative %: 50 %
Platelet Count: 201 10*3/uL (ref 150–400)
RBC: 4.45 MIL/uL (ref 3.87–5.11)
RDW: 13.2 % (ref 11.5–15.5)
WBC Count: 6 10*3/uL (ref 4.0–10.5)
nRBC: 0 % (ref 0.0–0.2)

## 2021-05-27 NOTE — Progress Notes (Signed)
[facility]  [phone] Fax:([phone]     ID: Kathryn Cannon DOB: 1971-07-06  MR#: [id number]  TOI#:[id number]  Patient Care Team: Quincy Simmonds, MD as PCP - General (Internal Medicine) Magrinat, Valentino Hue, MD as Consulting Physician (Oncology) Griselda Miner, MD as Consulting Physician (General Surgery) Chevis Pretty, MD as Resident (Internal Medicine) OTHER MD:  CHIEF COMPLAINT: Atypical ductal hyperplasia  CURRENT TREATMENT:  tamoxifen (10 mg)   INTERVAL HISTORY: Kathryn Cannon is here today for f/u of her atypical ductal hyperplasia and her increased risk for developing breast cancer.  She is accompanied by an interpreter  She continues on Tamoxifen at 10mg  daily.  She has an occasional hot flash, they are improving.    Since her last visit, she underwent bilateral diagnostic mammography with [facility] showing: breast density category C; no evidence of malignancy in either breast.   She follows with [facility] in [place].  She says she will typically see a different provider.  She says that she saw them last in May of this year.   She is waiting until she is 52 for a colonoscopy.    She is exercising by walking every morning.  She says that she occasionally misses a day, but for the most part she enjoys this.     REVIEW OF SYSTEMS: Review of Systems  Constitutional:  Negative for appetite change, chills, fatigue, fever and unexpected weight change.  HENT:   Negative for hearing loss, lump/mass and trouble swallowing.   Eyes:  Negative for eye problems and icterus.  Respiratory:  Negative for chest tightness, cough and shortness of breath.   Cardiovascular:  Negative for chest pain, leg swelling and palpitations.  Gastrointestinal:  Negative for abdominal distention, abdominal pain, constipation, diarrhea, nausea and vomiting.  Endocrine: Positive for hot flashes.  Genitourinary:  Negative for  difficulty urinating.   Musculoskeletal:  Negative for arthralgias.  Skin:  Negative for itching and rash.  Neurological:  Negative for dizziness, extremity weakness, headaches and numbness.  Hematological:  Negative for adenopathy. Does not bruise/bleed easily.  Psychiatric/Behavioral:  Negative for depression. The patient is not nervous/anxious.      COVID 19 VACCINATION STATUS: Refuses vaccinations   HISTORY OF CURRENT ILLNESS: From the original intake note:  The patient had screening mammography Dec 28, 2016 showing a possible change in the right breast.  On 01/12/2017 she had right diagnostic mammography with tomography and right breast ultrasonography at [facility] in [place].  This showed the breast density to be category C.  In the upper inner right breast there was an area of persistent distortion.  However there was no ultrasound correlate.  The right axilla was sonographically benign.   On 01/27/2017 the patient attempted right breast biopsy under tomo guidance but the abnormality could not be located so she proceeded to bilateral breast MRI February 14, 2017.  In the right breast there was no MRI correlate for the possible area of architectural distortion.  In the left breast however there was an oval circumscribed mass in the upper inner quadrant measuring 0.7 cm.  There was a second focal area of non-mass-like enhancement in the upper outer quadrant of the left breast measuring 0.8 cm.  There were no abnormal-appearing lymph nodes.  Biopsy of the 2 left breast areas in question was performed March 02, 2017 and showed (SAA March 04, 2017) fibrocystic change in the more outer biopsy and atypical ductal hyperplasia in the more inner located biopsy.  The patient then proceeded to left lumpectomy on April 27, 2017.  This showed (SZA [phone]) fibrocystic changes, but no evidence of malignancy.  She was referred to the high risk clinic for further evaluation  The patient's subsequent  history is as detailed below.   PAST MEDICAL HISTORY: Past Medical History:  Diagnosis Date   Atypical ductal hyperplasia of left breast    Headache    Hyperlipidemia     PAST SURGICAL HISTORY: Past Surgical History:  Procedure Laterality Date   BREAST EXCISIONAL BIOPSY Left 2018   BREAST LUMPECTOMY WITH RADIOACTIVE SEED LOCALIZATION Left 04/27/2017   Procedure: RADIOCATIVE SEED GUIDED LEFT BREAST LUMPECTOMY, ERAS PATHWAY;  Surgeon: Griselda Miner, MD;  Location: [facility] OR;  Service: General;  Laterality: Left;   BREAST SURGERY     CESAREAN SECTION     3 previous    FAMILY HISTORY Family History  Problem Relation Age of Onset   Diabetes Paternal Aunt    Breast cancer Neg Hx   As of October 2018 the patient's father is 86 years old and her mother 49 years old.  The patient has 6 brothers and 3 sisters.  There is no history of cancer in the family or the extended family to the best of her knowledge   GYNECOLOGIC HISTORY:  No LMP recorded. (Menstrual status: Irregular Periods). Menarche age 89, first live birth age 74, she is GX P3.  She is still having regular periods, which usually last 3-4 days, 1 of which is heavy   SOCIAL HISTORY: (Updated January 2022) She is originally from [place] city as is her husband. They have been in this area more than 20 years.  She is a housewife.  Her husband is a Curator. Their 3 sons are 3 and he works for [employer] in [place], her 48 year old studies Tourism and lives currently with the patient as does, her 48 year old.    ADVANCED DIRECTIVES: In the absence of any documentation to the contrary, the patient's spouse is their HCPOA.    HEALTH MAINTENANCE: Social History   Tobacco Use   Smoking status: Never   Smokeless tobacco: Never  Vaping Use   Vaping Use: Never used  Substance Use Topics   Alcohol use: No   Drug use: No     Colonoscopy:  PAP: 02/2020, negative  Bone density:   No Known Allergies  Current Outpatient  Medications  Medication Sig Dispense Refill   fenofibrate (TRICOR) 48 MG tablet Take 1 tablet (48 mg total) by mouth daily. (Patient not taking: Reported on 01/26/2021) 90 tablet 1   Magnesium Oxide 500 MG CAPS Take 500 mg by mouth daily.     tamoxifen (NOLVADEX) 10 MG tablet Take 1 tablet (10 mg total) by mouth 2 (two) times daily. 90 tablet 4   No current facility-administered medications for this visit.    OBJECTIVE:  Vitals:   05/27/21 1047  BP: 123/60  Pulse: 66  Resp: 16  Temp: 98.4 F (36.9 C)  SpO2: 100%     Body mass index is 27.15 kg/m.   Wt Readings from Last 3 Encounters:  05/27/21 143 lb 11.2 oz (65.2 kg)  03/12/21 141 lb 14.4 oz (64.4 kg)  01/26/21 143 lb 12.8 oz (65.2 kg)  ECOG FS:0 - Asymptomatic GENERAL: Patient is a well appearing female in no acute distress HEENT:  Sclerae anicteric.  Oropharynx clear and moist. No ulcerations or evidence of oropharyngeal candidiasis. Neck is supple.  NODES:  No  cervical, supraclavicular, or axillary lymphadenopathy palpated.  BREAST EXAM:  left breast s/p lumpectomy, benign, right breast benign LUNGS:  Clear to auscultation bilaterally.  No wheezes or rhonchi. HEART:  Regular rate and rhythm. No murmur appreciated. ABDOMEN:  Soft, nontender.  Positive, normoactive bowel sounds. No organomegaly palpated. MSK:  No focal spinal tenderness to palpation. Full range of motion bilaterally in the upper extremities. EXTREMITIES:  No peripheral edema.   SKIN:  Clear with no obvious rashes or skin changes. No nail dyscrasia. NEURO:  Nonfocal. Well oriented.  Appropriate affect.    LAB RESULTS:  CMP     Component Value Date/Time   NA 142 09/02/2020 1106   K 4.3 09/02/2020 1106   CL 108 09/02/2020 1106   CO2 28 09/02/2020 1106   GLUCOSE 103 (H) 09/02/2020 1106   BUN 14 09/02/2020 1106   CREATININE 0.59 09/02/2020 1106   CALCIUM 9.5 09/02/2020 1106   PROT 7.6 09/02/2020 1106   ALBUMIN 4.2 09/02/2020 1106   AST 23 09/02/2020  1106   ALT 18 09/02/2020 1106   ALKPHOS 29 (L) 09/02/2020 1106   BILITOT 0.3 09/02/2020 1106   GFRNONAA >60 09/02/2020 1106   Lab Results  Component Value Date   WBC 6.0 05/27/2021   NEUTROABS 3.0 05/27/2021   HGB 13.6 05/27/2021   HCT 39.9 05/27/2021   MCV 89.7 05/27/2021   PLT 201 05/27/2021      Chemistry      Component Value Date/Time   NA 142 09/02/2020 1106   K 4.3 09/02/2020 1106   CL 108 09/02/2020 1106   CO2 28 09/02/2020 1106   BUN 14 09/02/2020 1106   CREATININE 0.59 09/02/2020 1106      Component Value Date/Time   CALCIUM 9.5 09/02/2020 1106   ALKPHOS 29 (L) 09/02/2020 1106   AST 23 09/02/2020 1106   ALT 18 09/02/2020 1106   BILITOT 0.3 09/02/2020 1106      No results found for: LABCA2  No components found for: JQGBEE100  No results for input(s): INR in the last 168 hours.  No results found for: LABCA2  No results found for: FHQ197  No results found for: JOI325  No results found for: QDI264  No results found for: CA2729  No components found for: HGQUANT  No results found for: CEA1 / No results found for: CEA1   No results found for: AFPTUMOR  No results found for: CHROMOGRNA  No results found for: TOTALPROTELP, ALBUMINELP, A1GS, A2GS, BETS, BETA2SER, GAMS, MSPIKE, SPEI (this displays SPEP labs)  No results found for: KPAFRELGTCHN, LAMBDASER, KAPLAMBRATIO (kappa/lambda light chains)  No results found for: HGBA, HGBA2QUANT, HGBFQUANT, HGBSQUAN (Hemoglobinopathy evaluation)   No results found for: LDH  No results found for: IRON, TIBC, IRONPCTSAT (Iron and TIBC)  No results found for: FERRITIN  Urinalysis No results found for: COLORURINE, APPEARANCEUR, LABSPEC, PHURINE, GLUCOSEU, HGBUR, BILIRUBINUR, KETONESUR, PROTEINUR, UROBILINOGEN, NITRITE, LEUKOCYTESUR   STUDIES: No results found.   ELIGIBLE FOR AVAILABLE RESEARCH PROTOCOL: no  ASSESSMENT: 50 y.o. Spanish speaker from [place], [place] s/p left breast upper inner quadrant  biopsy March 02, 2017 showing atypical ductal hyperplasia  (1) left lumpectomy April 27, 2017 showed only fibrocystic changes, no evidence of malignancy.  (2) tamoxifen started June 13, 2017  (a) patient is premenopausal  (b) dose reduced to 10 mg daily as of January 2019 because of side effects   PLAN: Kathryn Cannon is here today for f/u of her increased risk for breast cancer.  She continues on  Tamoxifen at 10mg  daily that she is taking for risk reduction.  She tolerates this well and will continue for another year.    She was recommended to continue with annual mammograms, due again in 02/2022.    Kathryn Cannon is walking daily and I encouraged her to continue this physical activity.  She was recommended to f/u with her PCP to stay up to date with her health maitnenance in [place].    We will see Kathryn Cannon back in one year for follow-up.  At that point she can graduate.  She knows to call for any questions that may arise between now and her next appointment.  We are happy to see her sooner if needed.  Total encounter time 20 minutes in face to face visit time, chart review, order entry and documentation of encounter.    Baldwin park, NP 05/27/21 10:59 AM Medical Oncology and Hematology [facility] [address] [place], [place] Tel. [phone]    Fax. [phone]    *Total Encounter Time as defined by the Centers for Medicare and Medicaid Services includes, in addition to the face-to-face time of a patient visit (documented in the note above) non-face-to-face time: obtaining and reviewing outside history, ordering and reviewing medications, tests or procedures, care coordination (communications with other health care professionals or caregivers) and documentation in the medical record.

## 2021-06-03 ENCOUNTER — Ambulatory Visit: Payer: No Typology Code available for payment source | Admitting: Adult Health

## 2021-06-03 ENCOUNTER — Other Ambulatory Visit: Payer: No Typology Code available for payment source

## 2021-10-05 ENCOUNTER — Other Ambulatory Visit: Payer: Self-pay

## 2021-10-05 MED ORDER — TAMOXIFEN CITRATE 10 MG PO TABS: 10.0000 mg | ORAL_TABLET | Freq: Two times a day (BID) | ORAL | 3 refills | Status: DC

## 2022-02-15 ENCOUNTER — Other Ambulatory Visit: Payer: Self-pay

## 2022-02-15 DIAGNOSIS — Z1231 Encounter for screening mammogram for malignant neoplasm of breast: Secondary | ICD-10-CM

## 2022-04-15 ENCOUNTER — Ambulatory Visit: Payer: Self-pay | Admitting: Hematology and Oncology

## 2022-04-15 ENCOUNTER — Ambulatory Visit
Admission: RE | Admit: 2022-04-15 | Discharge: 2022-04-15 | Disposition: A | Discharge status: Home / Self Care | Payer: No Typology Code available for payment source | Source: Ambulatory Visit | Attending: Diagnostic Radiology | Admitting: Diagnostic Radiology

## 2022-04-15 VITALS — BP 126/77 | Wt 142.2 lb

## 2022-04-15 DIAGNOSIS — Z1231 Encounter for screening mammogram for malignant neoplasm of breast: Secondary | ICD-10-CM

## 2022-04-15 DIAGNOSIS — Z1211 Encounter for screening for malignant neoplasm of colon: Secondary | ICD-10-CM

## 2022-04-15 NOTE — Patient Instructions (Signed)
We discussed side effects of Tamoxifen including hot flashes, night sweats and joint pain. She is experiencing these side effects; however, at this time they are manageable. She has had one dose reduction due to toxicities and I have encouraged her to continue to monitor and report ongoing side effects. She is due to stop medicine in October this year, pending mammogram results and exam.

## 2022-04-15 NOTE — Progress Notes (Addendum)
Kathryn Cannon is a 51 y.o. female who presents to [facility] clinic today with no complaints . 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    Pap Smear: Pap not smear completed today. Last Pap smear was 03/06/2020 at [facility] clinic and was normal. Per patient has no history of an abnormal Pap smear. Last Pap smear result is available in Epic.   Physical exam: Breasts Breasts symmetrical. No skin abnormalities bilateral breasts. No nipple retraction bilateral breasts. No nipple discharge bilateral breasts. No lymphadenopathy. No lumps palpated bilateral breasts.     MS DIGITAL SCREENING TOMO BILATERAL  Result Date: 03/17/2021 CLINICAL DATA:  Screening. EXAM: DIGITAL SCREENING BILATERAL MAMMOGRAM WITH TOMOSYNTHESIS AND CAD TECHNIQUE: Bilateral screening digital craniocaudal and mediolateral oblique mammograms were obtained. Bilateral screening digital breast tomosynthesis was performed. The images were evaluated with computer-aided detection. COMPARISON:  Previous exam(s). ACR Breast Density Category c: The breast tissue is heterogeneously dense, which may obscure small masses. FINDINGS: There are no findings suspicious for malignancy. IMPRESSION: No mammographic evidence of malignancy. A result letter of this screening mammogram will be mailed directly to the patient. RECOMMENDATION: Screening mammogram in one year. (Code:SM-B-01Y) BI-RADS CATEGORY  1: Negative. Electronically Signed   By: Lillia Mountain M.D.   On: 03/17/2021 13:34   MS DIGITAL SCREENING TOMO BILATERAL  Result Date: 03/07/2020 CLINICAL DATA:  Screening. EXAM: DIGITAL SCREENING BILATERAL MAMMOGRAM WITH TOMO AND CAD COMPARISON:  Previous exam(s). ACR Breast Density Category c: The breast tissue is heterogeneously dense, which may obscure small masses. FINDINGS: There are no findings suspicious for malignancy. Images were processed  with CAD. IMPRESSION: No mammographic evidence of malignancy. A result letter of this screening mammogram will be mailed directly to the patient. RECOMMENDATION: Screening mammogram in one year. (Code:SM-B-01Y) BI-RADS CATEGORY  1: Negative. Electronically Signed   By: Audie Pinto M.D.   On: 03/07/2020 11:35   MS DIGITAL SCREENING TOMO BILATERAL  Result Date: 03/02/2019 CLINICAL DATA:  Screening. EXAM: DIGITAL SCREENING BILATERAL MAMMOGRAM WITH TOMO AND CAD COMPARISON:  Previous exam(s). ACR Breast Density Category c: The breast tissue is heterogeneously dense, which may obscure small masses. FINDINGS: There are no findings suspicious for malignancy. Images were processed with CAD. IMPRESSION: No mammographic evidence of malignancy. A result letter of this screening mammogram will be mailed directly to the patient. RECOMMENDATION: Screening mammogram in one year. (Code:SM-B-01Y) BI-RADS CATEGORY  1: Negative. Electronically Signed   By: Ammie Ferrier M.D.   On: 03/02/2019 12:49   MM DIAG BREAST TOMO BILATERAL  Result Date: 02/14/2018 CLINICAL DATA:  Patient presents for focal tenderness within the medial left breast. Patient had prior excisional biopsy at this site 04/2017. EXAM: DIGITAL DIAGNOSTIC BILATERAL MAMMOGRAM WITH CAD AND TOMO ULTRASOUND LEFT BREAST COMPARISON:  Previous exam(s). ACR Breast Density Category c: The breast tissue is heterogeneously dense, which may obscure small masses. FINDINGS: Post biopsy changes demonstrated within the medial left breast. No concerning masses, calcifications or distortion identified within either breast. Mammographic images were processed with CAD. On physical exam, palpate no discrete mass within the medial left breast. Targeted ultrasound is performed, showing normal tissue within the left breast 10 o'clock position 2 cm from nipple at the site of focal tenderness. IMPRESSION: No mammographic evidence for malignancy. RECOMMENDATION: Continued clinical  evaluation for reported left breast tenderness. Screening mammogram in one year.(Code:SM-B-01Y) I have discussed the findings and recommendations with the patient. Results were  also provided in writing at the conclusion of the visit. If applicable, a reminder letter will be sent to the patient regarding the next appointment. BI-RADS CATEGORY  2: Benign. Electronically Signed   By: Lovey Newcomer M.D.   On: 02/14/2018 15:02   MM Breast Surgical Specimen  Result Date: 04/27/2017 CLINICAL DATA:  Post left breast excision for recently diagnosed atypical ductal hyperplasia. EXAM: SPECIMEN RADIOGRAPH OF THE LEFT BREAST COMPARISON:  Previous exam(s). FINDINGS: Status post excision of the left breast. The radioactive seed and biopsy marker clip are present, completely intact, and were marked for pathology. IMPRESSION: Specimen radiograph of the left breast. Electronically Signed   By: Everlean Alstrom M.D.   On: 04/27/2017 09:05   MM LT RADIOACTIVE SEED LOC MAMMO GUIDE  Result Date: 04/26/2017 CLINICAL DATA:  51 year old female for localization of left breast ADH prior to excision. EXAM: MAMMOGRAPHIC GUIDED RADIOACTIVE SEED LOCALIZATION OF THE LEFT BREAST COMPARISON:  Previous exam(s). FINDINGS: Patient presents for radioactive seed localization prior to left breast excision. I met with the patient and we discussed the procedure of seed localization including benefits and alternatives. We discussed the high likelihood of a successful procedure. We discussed the risks of the procedure including infection, bleeding, tissue injury and further surgery. We discussed the low dose of radioactivity involved in the procedure. Informed, written consent was given. The usual time-out protocol was performed immediately prior to the procedure. Using mammographic guidance, sterile technique, 1% lidocaine and an I-125 radioactive seed, the cylinder-shaped biopsy clip within the inner left breast was localized using a superior  approach. The follow-up mammogram images confirm the seed in the expected location and were marked for Dr. Marlou Starks. Follow-up survey of the patient confirms presence of the radioactive seed. Order number of I-125 seed:  [id number]. Total activity:  2.956 millicuries  Reference Date: 04/21/2017 The patient tolerated the procedure well and was released from the [facility]. She was given instructions regarding seed removal. IMPRESSION: Radioactive seed localization left breast. No apparent complications. Electronically Signed   By: Margarette Canada M.D.   On: 04/26/2017 14:09      Pelvic/Bimanual Pap is not indicated today    Smoking History: Patient has never smoked and was not referred to quit line.    Patient Navigation: Patient education provided. Access to services provided for patient through [place] interpreter provided. No transportation provided   Colorectal Cancer Screening: Per patient has never had colonoscopy completed No complaints today. FIT test given.   Breast and Cervical Cancer Risk Assessment: Patient does not have family history of breast cancer, known genetic mutations, or radiation treatment to the chest before age 46. Patient does not have history of cervical dysplasia, immunocompromised, or DES exposure in-utero.  Risk Assessment     Risk Scores       04/15/2022 03/12/2021   Last edited by: Royston Bake, CMA [place], Sherie Mamie Nick, LPN   5-year risk: 1.1 % 1.1 %   Lifetime risk: 9.6 % 9.8 %            A: [facility] exam without pap smear Benign exam with no complaints. Due for screening mammogram.  P: Referred patient to the [facility] for a screening mammogram. Appointment scheduled 04/15/2022.  Melodye Ped, NP 04/15/2022 11:39 AM

## 2022-04-20 ENCOUNTER — Other Ambulatory Visit: Payer: Self-pay | Admitting: Obstetrics and Gynecology

## 2022-04-20 DIAGNOSIS — R928 Other abnormal and inconclusive findings on diagnostic imaging of breast: Secondary | ICD-10-CM

## 2022-04-29 ENCOUNTER — Telehealth: Payer: Self-pay

## 2022-04-29 LAB — FECAL OCCULT BLOOD, IMMUNOCHEMICAL: Fecal Occult Bld: NEGATIVE

## 2022-04-29 NOTE — Telephone Encounter (Signed)
Via Julie Sowell, Spanish Interpreter (Altha) Patient informed negative FIT test results, verbalized understanding.  

## 2022-05-03 ENCOUNTER — Ambulatory Visit
Admission: RE | Admit: 2022-05-03 | Discharge: 2022-05-03 | Disposition: A | Discharge status: Home / Self Care | Payer: No Typology Code available for payment source | Source: Ambulatory Visit | Attending: Obstetrics and Gynecology | Admitting: Obstetrics and Gynecology

## 2022-05-03 ENCOUNTER — Ambulatory Visit: Payer: No Typology Code available for payment source

## 2022-05-03 DIAGNOSIS — R928 Other abnormal and inconclusive findings on diagnostic imaging of breast: Secondary | ICD-10-CM

## 2022-05-24 ENCOUNTER — Other Ambulatory Visit: Payer: Self-pay

## 2022-05-24 ENCOUNTER — Inpatient Hospital Stay: Payer: No Typology Code available for payment source | Attending: Obstetrics and Gynecology | Admitting: *Deleted

## 2022-05-24 VITALS — BP 146/90 | Ht 61.0 in | Wt 141.1 lb

## 2022-05-24 DIAGNOSIS — Z0000 Encounter for general adult medical examination without abnormal findings: Secondary | ICD-10-CM

## 2022-05-24 DIAGNOSIS — N6092 Unspecified benign mammary dysplasia of left breast: Secondary | ICD-10-CM | POA: Insufficient documentation

## 2022-05-24 DIAGNOSIS — Z79810 Long term (current) use of selective estrogen receptor modulators (SERMs): Secondary | ICD-10-CM | POA: Insufficient documentation

## 2022-05-24 NOTE — Progress Notes (Signed)
[facility] initial screening   Interpreter- Javier Glazier, Erling Cruz   Clinical Measurement:  Vitals:   05/24/22 0917  BP: (!) 150/92   Fasting Labs Drawn Today, will review with patient when they result.   Medical History:  Patient states that she  does not know if she has  high cholesterol, does not have high blood pressure and she does not have diabetes.  Medications:  Patient states that she does not take medication to lower cholesterol, blood pressure or blood sugar.  Patient does not take an aspirin a day to help prevent a heart attack or stroke.    Blood pressure, self measurement: Patient states that she does measure blood pressure from home. She checks her blood pressure weekly. She shares her readings with a health care provider: no.   Nutrition: Patient states that on average she eats 0 cups of fruit and 0 cups of vegetables per day. Patient states that she does not eat fish at least 2 times per week. Patient eats less than half servings of whole grains. Patient drinks less than 36 ounces of beverages with added sugar weekly: yes. Patient is currently watching sodium or salt intake: yes. In the past 7 days patient has consumed drinks containing alcohol on 0 days. On a day that patient consumes drinks containing alcohol on average 0 drinks are consumed.      Physical activity:  Patient states that she gets 300 minutes of moderate and 0 minutes of vigorous physical activity each week.  Smoking status:  Patient states that she has has never smoked .   Quality of life:  Over the past 2 weeks patient states that she had little interest or pleasure in doing things: not at all. She has been feeling down, depressed or hopeless:not at all.    Risk reduction and counseling:   Health Coaching: Spoke with patient about the daily recommendations for fruits and vegetables. Showed patient what a serving size looks like. Patient consumes fish once a week. Gave suggestions for other heart healthy fish  that she can try adding into diet (salmon, tuna, mackerel, sardines, sea bass or trout). Patient consumes whole wheat bread, oatmeal and whole grain cereals but not regularly. Encouraged patient to try and add whole grains into regular diet. Spoke with patient about watching the amount of sodium that she consumes since blood pressure is elevated. Gave patient a tracking log for blood pressure. Instructed patient to check BP and log it daily until FU appointment with Internal Medicine. Patient has been walking 5 days a week for an hour. Encouraged patient to try and keep up with daily exercise.   Goal: Patient would like to work on practicing a more healthy diet and well balanced diet. In order to reach this goal patient will use food plate when preparing meals to make sure she is getting foods from the different food groups as well as the right portion sizes. Patient will work on reaching this goal over the next month.    Navigation:  I will notify patient of lab results.  Patient is aware of 2 more health coaching sessions and a follow up. Will refer patient for FU with Internal Medicine for elevated BP.   Time: 20 minutes

## 2022-05-25 LAB — LIPID PANEL
Chol/HDL Ratio: 8.9 ratio — ABNORMAL HIGH (ref 0.0–4.4)
Cholesterol, Total: 284 mg/dL — ABNORMAL HIGH (ref 100–199)
HDL: 32 mg/dL — ABNORMAL LOW (ref >39)
LDL Chol Calc (NIH): 142 mg/dL — ABNORMAL HIGH (ref 0–99)
Triglycerides: 580 mg/dL (ref 0–149)
VLDL Cholesterol Cal: 110 mg/dL — ABNORMAL HIGH (ref 5–40)

## 2022-05-25 LAB — GLUCOSE, RANDOM: Glucose: 110 mg/dL — ABNORMAL HIGH (ref 70–99)

## 2022-05-25 LAB — HEMOGLOBIN A1C
Est. average glucose Bld gHb Est-mCnc: 123 mg/dL
Hgb A1c MFr Bld: 5.9 % — ABNORMAL HIGH (ref 4.8–5.6)

## 2022-05-27 ENCOUNTER — Inpatient Hospital Stay (HOSPITAL_BASED_OUTPATIENT_CLINIC_OR_DEPARTMENT_OTHER): Payer: No Typology Code available for payment source | Admitting: Adult Health

## 2022-05-27 ENCOUNTER — Encounter: Payer: Self-pay | Admitting: Adult Health

## 2022-05-27 ENCOUNTER — Other Ambulatory Visit: Payer: Self-pay

## 2022-05-27 VITALS — BP 134/66 | HR 60 | Temp 97.9°F | Resp 16 | Ht 61.0 in | Wt 142.6 lb

## 2022-05-27 DIAGNOSIS — N6092 Unspecified benign mammary dysplasia of left breast: Secondary | ICD-10-CM

## 2022-05-27 NOTE — Assessment & Plan Note (Signed)
Kathryn Cannon is here today for follow-up of her atypical ductal hyperplasia of the left breast diagnosed in July 2018 status post lumpectomy followed by tamoxifen which began in October 2018.  She has completed 5 years of tamoxifen therapy and can stop at the end of this month.  I will go ahead and discontinue it from her list.  We reviewed that she does not have to come and see [place] on an annual basis now that she is finished with tamoxifen however I recommend that she continue to undergo annual mammograms.  I recommended healthy diet and continued exercise along with continued annual follow-up with [facility] clinic in order to receive a clinical breast exam and mammogram order.  We do not need to make a follow-up appointment today however she was instructed to call for any questions or concerns should they arise.

## 2022-05-27 NOTE — Progress Notes (Signed)
[facility] Cancer Follow up:    Quincy Simmonds, MD [address] [place] [address]   DIAGNOSIS: Atypical Ductal Hyperplasia  SUMMARY OF ONCOLOGIC HISTORY: Spanish speaker from [place], [place] s/p left breast upper inner quadrant biopsy March 02, 2017 showing atypical ductal hyperplasia   (1) left lumpectomy April 27, 2017 showed only fibrocystic changes, no evidence of malignancy.   (2) tamoxifen started June 13, 2017             (a) patient is premenopausal             (b) dose reduced to 10 mg daily as of January 2019 because of side effects  CURRENT THERAPY: Tamoxifen daily  INTERVAL HISTORY: Makenize Mercado-Vences 51 y.o. female returns for follow-up of her atypical ductal hyperplasia.  She is continued on tamoxifen with mild hot flashes otherwise tolerating well.  Her most recent mammogram was completed on April 15, 2022 which demonstrated possible asymmetry in the left breast therefore diagnostic mammogram was recommended.  Her diagnostic mammogram was completed on May 03, 2022 and showed no mammographic evidence of malignancy and no continued left breast asymmetry.  There was no mammographic evidence of malignancy on the right breast therefore annual screening mammogram was recommended to continue next due in September 2024.  Daley exercises with walking from time to time.  She sees the [facility] once a year for her breast exam and mammograms.   Patient Active Problem List   Diagnosis Date Noted   Pre-diabetes 03/27/2020   Hyperlipidemia 03/22/2019   Elevated blood pressure reading 03/22/2019   Screening breast examination 03/01/2019   Atypical ductal hyperplasia of left breast 06/13/2017    has No Known Allergies.  MEDICAL HISTORY: Past Medical History:  Diagnosis Date   Atypical ductal hyperplasia of left breast    Headache    Hyperlipidemia     SURGICAL HISTORY: Past Surgical History:  Procedure Laterality Date   BREAST  EXCISIONAL BIOPSY Left 2018   BREAST LUMPECTOMY WITH RADIOACTIVE SEED LOCALIZATION Left 04/27/2017   Procedure: RADIOCATIVE SEED GUIDED LEFT BREAST LUMPECTOMY, ERAS PATHWAY;  Surgeon: Griselda Miner, MD;  Location: [facility] OR;  Service: General;  Laterality: Left;   BREAST SURGERY     CESAREAN SECTION     3 previous    SOCIAL HISTORY: Social History   Socioeconomic History   Marital status: Married    Spouse name: Not on file   Number of children: 3   Years of education: Not on file   Highest education level: 7th grade  Occupational History   Not on file  Tobacco Use   Smoking status: Never   Smokeless tobacco: Never  Vaping Use   Vaping Use: Never used  Substance and Sexual Activity   Alcohol use: No   Drug use: No   Sexual activity: Yes    Birth control/protection: Condom  Other Topics Concern   Not on file  Social History Narrative   Not on file   Social Determinants of Health   Financial Resource Strain: Not on file  Food Insecurity: No Food Insecurity (05/24/2022)   Hunger Vital Sign    Worried About Running Out of Food in the Last Year: Never true    Ran Out of Food in the Last Year: Never true  Transportation Needs: No Transportation Needs (05/24/2022)   PRAPARE - Administrator, Civil Service (Medical): No    Lack of Transportation (Non-Medical): No  Physical Activity: Not on  file  Stress: No Stress Concern Present (05/24/2022)   [employer] of Occupational Health - Occupational Stress Questionnaire    Feeling of Stress : Only a little  Social Connections: Not on file  Intimate Partner Violence: Not At Risk (05/24/2022)   Humiliation, Afraid, Rape, and Kick questionnaire    Fear of Current or Ex-Partner: No    Emotionally Abused: No    Physically Abused: No    Sexually Abused: No    FAMILY HISTORY: Family History  Problem Relation Age of Onset   Diabetes Paternal Aunt    Breast cancer Neg Hx     Review of Systems  Constitutional:   Negative for appetite change, chills, fatigue, fever and unexpected weight change.  HENT:   Negative for hearing loss, lump/mass and trouble swallowing.   Eyes:  Negative for eye problems and icterus.  Respiratory:  Negative for chest tightness, cough and shortness of breath.   Cardiovascular:  Negative for chest pain, leg swelling and palpitations.  Gastrointestinal:  Negative for abdominal distention, abdominal pain, constipation, diarrhea, nausea and vomiting.  Endocrine: Positive for hot flashes.  Genitourinary:  Negative for difficulty urinating.   Musculoskeletal:  Negative for arthralgias.  Skin:  Negative for itching and rash.  Neurological:  Negative for dizziness, extremity weakness, headaches and numbness.  Hematological:  Negative for adenopathy. Does not bruise/bleed easily.  Psychiatric/Behavioral:  Negative for depression. The patient is not nervous/anxious.       PHYSICAL EXAMINATION  ECOG PERFORMANCE STATUS: 1 - Symptomatic but completely ambulatory  Vitals:   05/27/22 1015  BP: 134/66  Pulse: 60  Resp: 16  Temp: 97.9 F (36.6 C)  SpO2: 100%    Physical Exam Constitutional:      General: She is not in acute distress.    Appearance: Normal appearance. She is not toxic-appearing.  HENT:     Head: Normocephalic and atraumatic.  Eyes:     General: No scleral icterus. Cardiovascular:     Rate and Rhythm: Normal rate and regular rhythm.     Pulses: Normal pulses.     Heart sounds: Normal heart sounds.  Pulmonary:     Effort: Pulmonary effort is normal.     Breath sounds: Normal breath sounds.  Chest:     Comments: Left breast status postlumpectomy otherwise benign right breast is benign Abdominal:     General: Abdomen is flat. Bowel sounds are normal. There is no distension.     Palpations: Abdomen is soft.     Tenderness: There is no abdominal tenderness.  Musculoskeletal:        General: No swelling.     Cervical back: Neck supple.  Lymphadenopathy:      Cervical: No cervical adenopathy.  Skin:    General: Skin is warm and dry.     Findings: No rash.  Neurological:     General: No focal deficit present.     Mental Status: She is alert.  Psychiatric:        Mood and Affect: Mood normal.        Behavior: Behavior normal.     LABORATORY DATA:  None for today's visit    ASSESSMENT and THERAPY PLAN:   Atypical ductal hyperplasia of left breast Evann is here today for follow-up of her atypical ductal hyperplasia of the left breast diagnosed in July 2018 status post lumpectomy followed by tamoxifen which began in October 2018.  She has completed 5 years of tamoxifen therapy and can stop at the end  of this month.  I will go ahead and discontinue it from her list.  We reviewed that she does not have to come and see [place] on an annual basis now that she is finished with tamoxifen however I recommend that she continue to undergo annual mammograms.  I recommended healthy diet and continued exercise along with continued annual follow-up with [facility] clinic in order to receive a clinical breast exam and mammogram order.  We do not need to make a follow-up appointment today however she was instructed to call for any questions or concerns should they arise.   All questions were answered. The patient knows to call the clinic with any problems, questions or concerns. We can certainly see the patient much sooner if necessary.  Total encounter time:20 minutes*in face-to-face visit time, chart review, lab review, care coordination, order entry, and documentation of the encounter time.    Wilber Bihari, NP 05/27/22 11:00 AM Medical Oncology and Hematology [facility] [place], [place] [address] Tel. [phone]    Fax. [phone]  *Total Encounter Time as defined by the Centers for Medicare and Medicaid Services includes, in addition to the face-to-face time of a patient visit (documented in the note above)  non-face-to-face time: obtaining and reviewing outside history, ordering and reviewing medications, tests or procedures, care coordination (communications with other health care professionals or caregivers) and documentation in the medical record.

## 2022-06-02 ENCOUNTER — Telehealth: Payer: Self-pay

## 2022-06-02 NOTE — Telephone Encounter (Signed)
Attempted to reach patient to discuss [facility] lab results. Patient's voicemail is not set up, unable to leave message. Will try to reach patient at another time.

## 2022-06-02 NOTE — Telephone Encounter (Signed)
Health coaching 2   interpreter- Rudene Anda, [facility]   Labs-284 cholesterol, 142 LDL cholesterol, 580 triglycerides, 32 HDL cholesterol, 110 hemoglobin A1C, 110 mean plasma glucose. Patient understands and is aware of her lab results.   Goals-  Spoke with patient in detail about lab results. Spoke with patient about making changes to diet and practicing a heart healthy diet.  -Decreasing the amount of fried and fatty foods consumed. -Decrease the amount of processed foods consumed. -Decrease the amount of red meats consumed. Substitute for lean proteins like chicken and fish.  -Increase fruit and vegetable intake as well as whole grains (fiber rich foods). -Watch the amount of sweet and sugary foods consumed. -Cut back on the amount of carbs consumed/ watch portion sizes. -Continue with daily walks.   Navigation:  Patient is aware of 1 more health coaching sessions and a follow up. Patient is scheduled for FU with Internal Medicine on Monday, June 14, 2022 @ 10:15 am. Referred patient to Spanish DPP program.   Time-  20 minutes

## 2022-06-14 ENCOUNTER — Ambulatory Visit (INDEPENDENT_AMBULATORY_CARE_PROVIDER_SITE_OTHER): Payer: Self-pay | Admitting: Student

## 2022-06-14 ENCOUNTER — Encounter: Payer: Self-pay | Admitting: Student

## 2022-06-14 DIAGNOSIS — R030 Elevated blood-pressure reading, without diagnosis of hypertension: Secondary | ICD-10-CM

## 2022-06-14 DIAGNOSIS — E785 Hyperlipidemia, unspecified: Secondary | ICD-10-CM

## 2022-06-14 DIAGNOSIS — R7303 Prediabetes: Secondary | ICD-10-CM

## 2022-06-14 DIAGNOSIS — E781 Pure hyperglyceridemia: Secondary | ICD-10-CM

## 2022-06-14 MED ORDER — FENOFIBRATE 48 MG PO TABS: 48.0000 mg | ORAL_TABLET | Freq: Every day | ORAL | 3 refills | Status: DC

## 2022-06-14 NOTE — Assessment & Plan Note (Addendum)
Lipid Panel     Component Value Date/Time   CHOL 284 (H) 05/24/2022 0941   TRIG 580 (HH) 05/24/2022 0941   HDL 32 (L) 05/24/2022 0941   CHOLHDL 8.9 (H) 05/24/2022 0941   LDLCALC 142 (H) 05/24/2022 0941   LABVLDL 110 (H) 05/24/2022 0941   Recent lipid panel shows triglycerides of 580.  History of hypertriglyceridemia and started on fenofibrate. Improved and fenofibrate was discontinued. Her calculated ASCVD score was <5% so does not need statin therapy at this time. She is asymptomatic. We discussed healthy eating options and increasing physical activity.    Plan -Start fenofibrate 48 mg daily -Follow-up in 6 months, repeat lipid panel then

## 2022-06-14 NOTE — Progress Notes (Signed)
   CC: Wise Woman follow up visit  HPI:  [name] is a 51 y.o. female living with a history stated below and presents today for Wise Woman follow up. Please see problem based assessment and plan for additional details.  Virtual Spanish interpreter was used during this encounter.  Past Medical History:  Diagnosis Date   Atypical ductal hyperplasia of left breast    Headache    Hyperlipidemia     Current Outpatient Medications on File Prior to Visit  Medication Sig Dispense Refill   Magnesium Oxide 500 MG CAPS Take 500 mg by mouth daily.     No current facility-administered medications on file prior to visit.   Review of Systems: ROS negative except for what is noted on the assessment and plan.  Vitals:   06/14/22 1042  BP: 127/75  Pulse: (!) 53  Temp: 98.1 F (36.7 C)  TempSrc: Oral  SpO2: 96%  Weight: 143 lb 12.8 oz (65.2 kg)  Height: 5\' 1"  (1.549 m)   Physical Exam: Constitutional: well-appearing female, sitting in chair, in no acute distress HENT: normocephalic atraumatic Neck: supple Cardiovascular: regular rate and rhythm, no m/r/g Pulmonary/Chest: normal work of breathing on room air Abdominal: soft, non-tender, non-distended MSK: normal bulk and tone Neurological: alert & oriented x 3 Skin: warm and dry Psych: normal mood and behavior  Assessment & Plan:   Pre-diabetes Patient with pre-diabetes, A1c was 5.9 earlier this month. She reports she is trying to eat healthier and be more physically active.  She tries to walk throughout the week.  Plan -Continue lifestyle modifications -repeat A1c in one year  Elevated blood pressure reading BP: 127/75  BP is in normal range today.  She is not on any hypertension medications. Home BP log ranges mostly 110-120/70-80 with one SBP in 150. We discussed she can check her BP at home once weekly and if consistently elevated to contact the clinic. Discussed healthy eating and physical activity.    Hyperlipidemia Lipid Panel     Component Value Date/Time   CHOL 284 (H) 05/24/2022 0941   TRIG 580 (HH) 05/24/2022 0941   HDL 32 (L) 05/24/2022 0941   CHOLHDL 8.9 (H) 05/24/2022 0941   LDLCALC 142 (H) 05/24/2022 0941   LABVLDL 110 (H) 05/24/2022 0941   Recent lipid panel shows triglycerides of 580.  History of hypertriglyceridemia and started on fenofibrate. Improved and fenofibrate was discontinued. Her calculated ASCVD score was <5% so does not need statin therapy at this time. She is asymptomatic. We discussed healthy eating options and increasing physical activity.    Plan -Start fenofibrate 48 mg daily -Follow-up in 6 months, repeat lipid panel then   Patient seen with Dr. Ronna Polio, D.O. [facility], PGY-1 Phone: [phone] Date 06/14/2022 Time 12:41 PM

## 2022-06-14 NOTE — Assessment & Plan Note (Signed)
BP: 127/75  BP is in normal range today.  She is not on any hypertension medications. Home BP log ranges mostly 110-120/70-80 with one SBP in 150. We discussed she can check her BP at home once weekly and if consistently elevated to contact the clinic. Discussed healthy eating and physical activity.

## 2022-06-14 NOTE — Patient Instructions (Signed)
Kathryn Cannon. Kathryn Cannon por permitirnos brindarle atencin hoy.   -Colesterol: Su colesterol y triglicridos estaban altos. Reiniciaremos su fenofibrato 48 mg diarios.   -Presin arterial: la presin arterial se ve bien hoy. Puedes medirte la presin arterial en casa una vez a la semana si lo prefieres. Si nota que la presin es alta de manera constante, puede verificarla con ms frecuencia. Pngase en contacto con nuestra oficina clnica.   -Prediabetes: A1c es de 5,9. Por favor, contine con su alimentacin saludable  ----  Thank you, Kathryn Cannon for allowing [place] to provide your care today.   -Cholesterol: Your cholesterol and triglycerides were high. We will restart your fenofibrate 48 mg daily.   -Blood Pressure: blood pressure looks good today. You can measure your blood pressure at home once a week if you prefer. If you notice the pressure is high consistently then you can check more frequently. Please contact our clinic office.   -Pre-diabetes: A1c is 5.9. Please continue with your healthy eating and exercise.   -Por favor, haga un seguimiento en la prxima visita al consultorio en 6 meses.  I have ordered the following medication/changed the following medications:   Stop the following medications: Medications Discontinued During This Encounter  Medication Reason   fenofibrate (TRICOR) 48 MG tablet Reorder     Start the following medications: Meds ordered this encounter  Medications   fenofibrate (TRICOR) 48 MG tablet    Sig: Take 1 tablet (48 mg total) by mouth daily.    Dispense:  90 tablet    Refill:  3     Follow up: 6 months    Si tiene alguna pregunta o inquietud, llame a la clnica de medicina interna al [phone].  Angelique Blonder, D.O. [place]

## 2022-06-14 NOTE — Assessment & Plan Note (Addendum)
Patient with pre-diabetes, A1c was 5.9 earlier this month. She reports she is trying to eat healthier and be more physically active.  She tries to walk throughout the week.  Plan -Continue lifestyle modifications -repeat A1c in one year

## 2022-06-23 NOTE — Progress Notes (Signed)
Internal Medicine Clinic Attending  I saw and evaluated the patient.  I personally confirmed the key portions of the history and exam documented by Dr. Zheng and I reviewed pertinent patient test results.  The assessment, diagnosis, and plan were formulated together and I agree with the documentation in the resident's note.  

## 2022-09-15 ENCOUNTER — Telehealth: Payer: Self-pay

## 2022-09-15 NOTE — Telephone Encounter (Signed)
Health Coaching 3: 09/13/22  interpreter- Rudene Anda, [employer]   Goals- Patient would like to work on practicing a more healthy diet and well balanced diet. In order to reach this goal patient will use food plate when preparing meals to make sure she is getting foods from the different food groups as well as the right portion sizes. Patient will work on reaching this goal over the next month.     New goal-  Patient has been trying to eat more fruits and vegetables. Patient has cut back on the amount of tortillas that she consumes. Patient has also cut back on the amount of fats that she consumes and has started using olive oil. Patient has not been walking recently.  Barrier to reaching goal- Too cold.   Strategies to overcome-  [place] walking [place] the [place].   Navigation:  Patient is aware of  a follow up session. Patient is scheduled for FU on 10/18/22 @ 11:15 am.   Time-  10 minutes

## 2022-10-18 ENCOUNTER — Inpatient Hospital Stay: Payer: Self-pay | Attending: Obstetrics and Gynecology | Admitting: *Deleted

## 2022-10-18 VITALS — BP 128/88 | Ht 61.0 in | Wt 143.5 lb

## 2022-10-18 DIAGNOSIS — Z0000 Encounter for general adult medical examination without abnormal findings: Secondary | ICD-10-CM

## 2022-10-18 NOTE — Progress Notes (Signed)
Wisewoman follow up   Interpreter: Rudene Anda, [employer]  Clinical Measurement:   Vitals:   10/18/22 1124  BP: 128/84      Medical History: Patient states that she has high cholesterol, does not have high blood pressure and she does not have diabetes.  Medications: Patient states that she does take medication to lower cholesterol, blood pressure and blood sugar.  Patient does not take an aspirin a day to help prevent a heart attack or stroke. During the past 7 days patient has taken prescribed medication to lower cholesterol on 7 days.   Blood pressure, self measurement: Patient states that she does measure blood pressure from home. She checks her blood pressure weekly. She shares her readings with a health care provider: yes.   Nutrition: Patient states that on average she eats 2 cups of fruit and 3 cups of vegetables per day. Patient states that she does not eat fish at least 2 times per week. Patient eats about half servings of whole grains. Patient drinks less than 36 ounces of beverages with added sugar weekly: yes. Patient is currently watching sodium or salt intake: yes. In the past 7 days patient has had 0 drinks containing alcohol. On average patient drinks 0 drinks containing alcohol per day.      Physical activity: Patient states that she gets 180 minutes of moderate and 0 minutes of vigorous physical activity each week.  Smoking status: Patient states that she has has never smoked .   Quality of life: Over the past 2 weeks patient states that she had little interest or pleasure in doing things: not at all. She has been feeling down, depressed or hopeless:not at all.   Social Determinants of Health Assessment:   Food Insecurities: During the last 12 months, where there any times when you were worried that you would run out of food because of a lack of money or other resources: No.   Transportation Barriers: During the last 12 months, have you missed a doctor's appointment  because of transportation problems: No.    Risk reduction and counseling:   Health Coaching: Patient has been eating a variety of fruits and vegetables daily. Patient has been eating at least one serving of fish weekly. Patient has been consuming whole wheat bread and oatmeal daily. Patient has been walking 3 times a week for 60 minutes. Patient has been taking medication daily for cholesterol. Encouraged patient to continue practicing heart healthy diet. Will continue to monitor patients blood pressure at upcoming visits. Encouraged patient to continue checking BP at home and to follow-up with PCP with any elevated readings.   Navigation: This was the  follow up session for this patient, I will check up on her progress in the coming months.  Time: 20 minutes

## 2023-01-12 ENCOUNTER — Encounter: Payer: Self-pay | Admitting: *Deleted

## 2023-04-05 ENCOUNTER — Encounter: Payer: Self-pay | Admitting: Obstetrics and Gynecology

## 2023-04-05 DIAGNOSIS — Z1231 Encounter for screening mammogram for malignant neoplasm of breast: Secondary | ICD-10-CM

## 2023-04-11 ENCOUNTER — Other Ambulatory Visit: Payer: Self-pay | Admitting: Obstetrics and Gynecology

## 2023-04-11 DIAGNOSIS — Z1231 Encounter for screening mammogram for malignant neoplasm of breast: Secondary | ICD-10-CM

## 2023-05-25 ENCOUNTER — Other Ambulatory Visit: Payer: Self-pay

## 2023-05-25 ENCOUNTER — Inpatient Hospital Stay: Payer: Self-pay | Admitting: *Deleted

## 2023-05-25 VITALS — BP 140/88 | Ht 61.0 in | Wt 147.6 lb

## 2023-05-25 DIAGNOSIS — Z0000 Encounter for general adult medical examination without abnormal findings: Secondary | ICD-10-CM

## 2023-05-25 NOTE — Progress Notes (Addendum)
Wisewoman initial screening   Interpreter- [name], [employer]   Clinical Measurement: There were no vitals filed for this visit. Fasting Labs Drawn Today, will review with patient when they result.   Medical History: Patient states that she has high cholesterol, does not have high blood pressure and she does not have diabetes.  Medications: Patient states that she does take medication to lower cholesterol, blood pressure or blood sugar.  Patient does not take an aspirin a day to help prevent a heart attack or stroke. During the past 7 days patient has taken prescribed medication to lower cholesterol on 7 days.   Blood pressure, self measurement: Patient states that she does measure blood pressure from home. She checks her blood pressure a few times per week. She shares her readings with a health care provider: no.   Nutrition: Patient states that on average she eats 2 cups of fruit and 2 cups of vegetables per day. Patient states that she does eat fish at least 2 times per week. Patient eats more than half servings of whole grains. Patient drinks less than 36 ounces of beverages with added sugar weekly: yes. Patient is currently watching sodium or salt intake: yes. In the past 7 days patient has consumed drinks containing alcohol on 0 days. On a day that patient consumes drinks containing alcohol on average 0 drinks are consumed.      Physical activity: Patient states that she gets 0 minutes of moderate and 0 minutes of vigorous physical activity each week.  Smoking status: Patient states that she has has never smoked .   Quality of life: Over the past 2 weeks patient states that she had little interest or pleasure in doing things: not at all. She has been feeling down, depressed or hopeless:not at all.   Social Determinants of Health Assessment:   Computer Use: During the last 12 months patient states that she has used any of the following: desktop/laptop, smart phone or tablet/other  portable wireless computer: yes.   Internet Use: During the last 12 months, did you or any member of your household have access to the internet: Yes, by paying a cell phone company or internet service provider.   Food Insecurities: During the last 12 months, where there any times when you were worried that you would run out of food because of a lack of money or other resources: No.   Transportation Barriers: During the last 12 months, have you missed a doctor's appointment because of transportation problems: No.   Childcare Barriers: If you are currently using childcare services, please identify  the type of services you use. (If not using childcare services, please select "Not applicable"): not applicable. During the last 12 months, have you had any barriers to childcare services such as: not applicable.   Housing: What is your housing situation today: I have housing.   Intimate Partner Violence: During the last 12 months, how often did your partner physically hurt you: never. During the last 12 months, how often did your partner insult you or talk down to you: never.  Medication Adherence: During the last 12 months, did you ever forget to take your medicine: No. During the last 12 months, were you careless ar times about taking your medicine: No. During the last 12 months, when you felt better did you sometimes stop taking your medication: No. During the last 12 months, sometimes if you felt worse when you took your medicine did you stop taking it: No.   Risk  reduction and counseling:   Health Coaching: Spoke with patient about the daily recommendation for fruits and vegetables. Showed patient what a serving size would look like. Patient has been consuming heart healthy fish 2-3 times/servings per week. Patient has been consuming a wide variety of whole grains on a regular basis. Patient has been watching her sugar and salt intake. Patient has not been walking recently due to pain that she was  experiencing in legs when previously walking. Patient has not walked in some time. She was walking several miles per day. Encouraged her to try and start with walking short distances for a short period of time to see if she experiences any pain. If she has no pain she can increase over time.   Goal: Patient will try to start back with walking. She will try walking 2 days a week for 10-15 minutes over the next month. As long as she does not have any pain she will increase to 3-4 days per week for 10-15 minutes.    Navigation:  I will notify patient of lab results.  Patient is aware of 2 more health coaching sessions and a follow up.  Time: 25 minutes

## 2023-05-26 LAB — HEMOGLOBIN A1C
Est. average glucose Bld gHb Est-mCnc: 117 mg/dL
Hgb A1c MFr Bld: 5.7 % — ABNORMAL HIGH (ref 4.8–5.6)

## 2023-05-26 LAB — LIPID PANEL
Chol/HDL Ratio: 5.6 {ratio} — ABNORMAL HIGH (ref 0.0–4.4)
Cholesterol, Total: 280 mg/dL — ABNORMAL HIGH (ref 100–199)
HDL: 50 mg/dL (ref >39)
LDL Chol Calc (NIH): 182 mg/dL — ABNORMAL HIGH (ref 0–99)
Triglycerides: 253 mg/dL — ABNORMAL HIGH (ref 0–149)
VLDL Cholesterol Cal: 48 mg/dL — ABNORMAL HIGH (ref 5–40)

## 2023-05-26 LAB — GLUCOSE, RANDOM: Glucose: 103 mg/dL — ABNORMAL HIGH (ref 70–99)

## 2023-05-30 ENCOUNTER — Telehealth: Payer: Self-pay

## 2023-05-30 NOTE — Telephone Encounter (Signed)
Health coaching 2   interpreter- [name], [employer]   Labs- 280 cholesterol, 182 LDL cholesterol, 253 triglycerides, 50 HDL cholesterol, 5.7  hemoglobin A1C, 103 mean plasma glucose. Patient understands and is aware of her lab results.   Goals- 1. Continue with medication daily as recommended by PCP. 2. Continue trying to practice heart healthy diet. Avoiding fried and fatty foods as well as saturated and processed foods. 3. Start back with daily walking. Start off with short distances for 10-15 minutes and increase over time if no dizziness is experienced. 4. Continue watching the amount of sweets and sugars consumed in both food and drinks. As well as carb-rich foods.   Navigation:  Patient is aware of 1 more health coaching sessions and a follow up. Patient is scheduled for follow-up with Internal Medicine on Wednesday, June 22, 2023 @ 10:15 am.  Time- 15 minutes

## 2023-06-09 ENCOUNTER — Ambulatory Visit
Admission: RE | Admit: 2023-06-09 | Discharge: 2023-06-09 | Disposition: A | Discharge status: Home / Self Care | Payer: No Typology Code available for payment source | Source: Ambulatory Visit | Attending: Obstetrics and Gynecology | Admitting: Obstetrics and Gynecology

## 2023-06-09 ENCOUNTER — Ambulatory Visit: Payer: Self-pay | Admitting: Hematology and Oncology

## 2023-06-09 VITALS — BP 148/78 | Wt 161.0 lb

## 2023-06-09 DIAGNOSIS — Z1231 Encounter for screening mammogram for malignant neoplasm of breast: Secondary | ICD-10-CM

## 2023-06-09 DIAGNOSIS — Z1211 Encounter for screening for malignant neoplasm of colon: Secondary | ICD-10-CM

## 2023-06-09 NOTE — Patient Instructions (Signed)
Taught Kathryn Cannon about self breast awareness and gave educational materials to take home. Patient did not need a Pap smear today due to last Pap smear was in 03/06/2020 per patient. Let her know B[facility]CP will cover Pap smears every 5 years unless has a history of abnormal Pap smears. Referred patient to the [facility] for screening mammogram. Appointment scheduled for 06/09/23. Patient aware of appointment and will be there. Let patient know will follow up with her within the next couple weeks with results. Gregory Cannon verbalized understanding.  Pascal Lux, NP 9:32 AM

## 2023-06-09 NOTE — Progress Notes (Signed)
[name] is a 52 y.o. female who presents to [facility] clinic today with no complaints.    Pap Smear: Pap not smear completed today. Last Pap smear was 03/06/2020 and was normal. Per patient has no history of an abnormal Pap smear. Last Pap smear result is available in Epic.   Physical exam: Breasts Breasts symmetrical. No skin abnormalities bilateral breasts. No nipple retraction bilateral breasts. No nipple discharge bilateral breasts. No lymphadenopathy. No lumps palpated bilateral breasts.  MS DIGITAL DIAG TOMO UNI LEFT  Result Date: 05/03/2022 CLINICAL DATA:  The patient was called back for a left breast asymmetry. EXAM: DIGITAL DIAGNOSTIC UNILATERAL LEFT MAMMOGRAM WITH TOMOSYNTHESIS TECHNIQUE: Left digital diagnostic mammography and breast tomosynthesis was performed. COMPARISON:  Previous exam(s). ACR Breast Density Category c: The breast tissue is heterogeneously dense, which may obscure small masses. FINDINGS: The left breast asymmetry resolves on today's imaging. IMPRESSION: No mammographic evidence of malignancy. RECOMMENDATION: Annual screening mammography. I have discussed the findings and recommendations with the patient. If applicable, a reminder letter will be sent to the patient regarding the next appointment. BI-RADS CATEGORY  2: Benign. Electronically Signed   By: Gerome Sam III M.D.   On: 05/03/2022 10:39  MS DIGITAL SCREENING TOMO BILATERAL  Result Date: 04/16/2022 CLINICAL DATA:  Screening. EXAM: DIGITAL SCREENING BILATERAL MAMMOGRAM WITH TOMOSYNTHESIS AND CAD TECHNIQUE: Bilateral screening digital craniocaudal and mediolateral oblique mammograms were obtained. Bilateral screening digital breast tomosynthesis was performed. The images were evaluated with computer-aided detection. COMPARISON:  Previous exam(s). ACR Breast Density Category c: The breast tissue is heterogeneously dense, which may obscure small masses. FINDINGS: In the left breast, a possible  asymmetry warrants further evaluation. In the right breast, no findings suspicious for malignancy. IMPRESSION: Further evaluation is suggested for possible asymmetry in the left breast. RECOMMENDATION: Diagnostic mammogram and possibly ultrasound of the left breast. (Code:FI-L-52M) The patient will be contacted regarding the findings, and additional imaging will be scheduled. BI-RADS CATEGORY  0: Incomplete. Need additional imaging evaluation and/or prior mammograms for comparison. Electronically Signed   By: Gerome Sam III M.D.   On: 04/16/2022 17:53   MS DIGITAL SCREENING TOMO BILATERAL  Result Date: 03/17/2021 CLINICAL DATA:  Screening. EXAM: DIGITAL SCREENING BILATERAL MAMMOGRAM WITH TOMOSYNTHESIS AND CAD TECHNIQUE: Bilateral screening digital craniocaudal and mediolateral oblique mammograms were obtained. Bilateral screening digital breast tomosynthesis was performed. The images were evaluated with computer-aided detection. COMPARISON:  Previous exam(s). ACR Breast Density Category c: The breast tissue is heterogeneously dense, which may obscure small masses. FINDINGS: There are no findings suspicious for malignancy. IMPRESSION: No mammographic evidence of malignancy. A result letter of this screening mammogram will be mailed directly to the patient. RECOMMENDATION: Screening mammogram in one year. (Code:SM-B-01Y) BI-RADS CATEGORY  1: Negative. Electronically Signed   By: Baird Lyons M.D.   On: 03/17/2021 13:34   MS DIGITAL SCREENING TOMO BILATERAL  Result Date: 03/07/2020 CLINICAL DATA:  Screening. EXAM: DIGITAL SCREENING BILATERAL MAMMOGRAM WITH TOMO AND CAD COMPARISON:  Previous exam(s). ACR Breast Density Category c: The breast tissue is heterogeneously dense, which may obscure small masses. FINDINGS: There are no findings suspicious for malignancy. Images were processed with CAD. IMPRESSION: No mammographic evidence of malignancy. A result letter of this screening mammogram will be mailed  directly to the patient. RECOMMENDATION: Screening mammogram in one year. (Code:SM-B-01Y) BI-RADS CATEGORY  1: Negative. Electronically Signed   By: Emmaline Kluver M.D.   On: 03/07/2020 11:35   MS DIGITAL SCREENING TOMO BILATERAL  Result Date:  03/02/2019 CLINICAL DATA:  Screening. EXAM: DIGITAL SCREENING BILATERAL MAMMOGRAM WITH TOMO AND CAD COMPARISON:  Previous exam(s). ACR Breast Density Category c: The breast tissue is heterogeneously dense, which may obscure small masses. FINDINGS: There are no findings suspicious for malignancy. Images were processed with CAD. IMPRESSION: No mammographic evidence of malignancy. A result letter of this screening mammogram will be mailed directly to the patient. RECOMMENDATION: Screening mammogram in one year. (Code:SM-B-01Y) BI-RADS CATEGORY  1: Negative. Electronically Signed   By: Frederico Hamman M.D.   On: 03/02/2019 12:49         Pelvic/Bimanual Pap is not indicated today    Smoking History: Patient has never smoked and was not referred to quit line.    Patient Navigation: Patient education provided. Access to services provided for patient through BCCCP program. Kathryn Cannon interpreter provided. No transportation provided   Colorectal Cancer Screening: Per patient has never had colonoscopy completed No complaints today. FIT test given.    Breast and Cervical Cancer Risk Assessment: Patient does not have family history of breast cancer, known genetic mutations, or radiation treatment to the chest before age 84. Patient does not have history of cervical dysplasia, immunocompromised, or DES exposure in-utero.  Risk Scores as of Encounter on 06/09/2023     Kathryn Cannon           5-year 1.46%   Lifetime 13.2%   This patient is Hisp[place]/Latina but has no documented birth country, so the [place] model used data from [place] patients to calculate their risk score. Document a birth country in the Demographics activity for a more accurate score.          Last calculated by Kathryn Cannon, CMA on 06/09/2023 at  9:10 AM        A: BCCCP exam without pap smear No complaints with benign exam.   P: Referred patient to the [facility] for a screening mammogram. Appointment scheduled 06/09/23.  Pascal Lux, NP 06/09/2023 9:29 AM

## 2023-06-22 ENCOUNTER — Ambulatory Visit (INDEPENDENT_AMBULATORY_CARE_PROVIDER_SITE_OTHER): Payer: Self-pay | Admitting: Student

## 2023-06-22 ENCOUNTER — Encounter: Payer: Self-pay | Admitting: Student

## 2023-06-22 VITALS — BP 128/87 | HR 53 | Temp 98.3°F | Ht 61.0 in | Wt 152.3 lb

## 2023-06-22 DIAGNOSIS — E781 Pure hyperglyceridemia: Secondary | ICD-10-CM

## 2023-06-22 DIAGNOSIS — E785 Hyperlipidemia, unspecified: Secondary | ICD-10-CM

## 2023-06-22 MED ORDER — FENOFIBRATE 48 MG PO TABS: 48.0000 mg | ORAL_TABLET | Freq: Every day | ORAL | 3 refills | Status: DC

## 2023-06-22 NOTE — Patient Instructions (Addendum)
Thank you so much for coming to the clinic today!   I really would like you to start walking as much as you can. We will follow up in six months to check your cholesterol again.   If you have any questions please feel free to the call the clinic at anytime at [phone]. It was a pleasure seeing you!  Best, Dr. Vergie Living gracias por venir a la clnica hoy!   Realmente me gustara que empezaras a caminar lo ms que puedas. Le haremos un seguimiento en seis meses para controlar su colesterol nuevamente.   Si tiene [employer], no dude en llamar a la clnica en cualquier momento al [phone]. Fue Psychiatrist verte!  Mejor, Dr.Savior Himebaugh

## 2023-06-22 NOTE — Assessment & Plan Note (Signed)
Patient presents for follow-up regarding her hyperlipidemia.  Since her last visit, she was started on fenofibrate 48 mg which she had just ran out of last week.  Her triglycerides are downtrending from 580 last year to 253 4 weeks ago.  She is eating better, with a diet consisting of fish, 3 cups of vegetables, and 2 cups of fruits.  However she is not exercising.  I encouraged her to walk as much as she can.  Her LDL has been increasing since 2020.  Currently it sits at 182.  Per guidelines, she is not statin candidate at this time with an ASCVD risk factor of 2.9%, and LDL less than 190 with no risk factors such as smoking, hypertension, or diabetes.  At this time, will reinitiate fenofibrate therapy, and follow-up in 6 months for a repeat lipid panel and consideration of statin therapy.

## 2023-06-22 NOTE — Progress Notes (Signed)
CC: HLD follow up  HPI:  [name] is a 52 y.o. female living with a history stated below and presents today for HLD follow up. Please see problem based assessment and plan for additional details.  Past Medical History:  Diagnosis Date   Atypical ductal hyperplasia of left breast    Headache    Hyperlipidemia     Current Outpatient Medications on File Prior to Visit  Medication Sig Dispense Refill   acetaminophen (TYLENOL) 500 MG tablet Take 1,000 mg by mouth every 8 (eight) hours as needed for headache. (Patient not taking: Reported on 06/09/2023)     Magnesium Oxide 500 MG CAPS Take 500 mg by mouth daily.     No current facility-administered medications on file prior to visit.    Family History  Problem Relation Age of Onset   Diabetes Paternal Aunt    Breast cancer Neg Hx     Social History   Socioeconomic History   Marital status: Married    Spouse name: Not on file   Number of children: 3   Years of education: Not on file   Highest education level: 7th grade  Occupational History   Not on file  Tobacco Use   Smoking status: Never   Smokeless tobacco: Never  Vaping Use   Vaping status: Never Used  Substance and Sexual Activity   Alcohol use: No   Drug use: No   Sexual activity: Yes    Birth control/protection: Condom  Other Topics Concern   Not on file  Social History Narrative   Not on file   Social Determinants of Health   Financial Resource Strain: Not on File (08/11/2022)   Received from [employer], [employer]    Financial Resource Strain: 0  Food Insecurity: No Food Insecurity (06/09/2023)   Hunger Vital Sign    Worried About Running Out of Food in the Last Year: Never true    Ran Out of Food in the Last Year: Never true  Transportation Needs: No Transportation Needs (06/09/2023)   PRAPARE - Administrator, Civil Service (Medical): No    Lack of Transportation (Non-Medical): No  Physical  Activity: Not on File (08/11/2022)   Received from [place], [place]   Physical Activity    Physical Activity: 0  Stress: Not on File (08/11/2022)   Received from [facility], [place]   Stress    Stress: 0  Social Connections: Not on File (05/12/2023)   Received from [facility]   Social Connections    Connectedness: 0  Intimate Partner Violence: Not At Risk (05/24/2022)   Humiliation, Afraid, Rape, and Kick questionnaire    Fear of Current or Ex-Partner: No    Emotionally Abused: No    Physically Abused: No    Sexually Abused: No    Review of Systems: ROS negative except for what is noted on the assessment and plan.  Vitals:   06/22/23 1010  BP: 128/87  Pulse: (!) 53  Temp: 98.3 F (36.8 C)  TempSrc: Oral  SpO2: 99%  Weight: 152 lb 4.8 oz (69.1 kg)  Height: 5\' 1"  (1.549 m)    Physical Exam: Constitutional: well-appearing female  in no acute distress Cardiovascular: regular rate and rhythm, no m/r/g Pulmonary/Chest: normal work of breathing on room air, lungs clear to auscultation bilaterally   Assessment & Plan:   Hyperlipidemia Patient presents for follow-up regarding her hyperlipidemia.  Since her last visit, Kathryn was started on fenofibrate 48 mg which  Kathryn had just ran out of last week.  Her triglycerides are downtrending from 580 last year to 253 4 weeks ago.  Kathryn is eating better, with a diet consisting of fish, 3 cups of vegetables, and 2 cups of fruits.  However Kathryn is not exercising.  I encouraged her to walk as much as Kathryn can.  Her LDL has been increasing since 2020.  Currently it sits at 182.  Per guidelines, Kathryn is not statin candidate at this time with an ASCVD risk factor of 2.9%, and LDL less than 190 with no risk factors such as smoking, hypertension, or diabetes.  At this time, will reinitiate fenofibrate therapy, and follow-up in 6 months for a repeat lipid panel and consideration of statin therapy.    Patient discussed with Dr. Pauline Cannon Shelbia Cannon, M.D. [facility]  Health Internal Medicine, PGY-2 Pager: [phone] Date 06/22/2023 Time 11:46 AM

## 2023-06-26 NOTE — Progress Notes (Signed)
Internal Medicine Clinic Attending  Case discussed with the resident at the time of the visit.  We reviewed the resident's history and exam and pertinent patient test results.  I agree with the assessment, diagnosis, and plan of care documented in the resident's note.  

## 2023-07-06 ENCOUNTER — Encounter: Payer: Self-pay | Admitting: Obstetrics and Gynecology

## 2023-07-06 ENCOUNTER — Other Ambulatory Visit (HOSPITAL_COMMUNITY)
Admission: RE | Admit: 2023-07-06 | Discharge: 2023-07-06 | Disposition: A | Discharge status: Home / Self Care | Payer: Self-pay | Source: Ambulatory Visit | Attending: Obstetrics and Gynecology | Admitting: Obstetrics and Gynecology

## 2023-07-06 ENCOUNTER — Ambulatory Visit (INDEPENDENT_AMBULATORY_CARE_PROVIDER_SITE_OTHER): Payer: Self-pay | Admitting: Obstetrics and Gynecology

## 2023-07-06 VITALS — BP 127/75 | HR 63 | Ht 61.0 in | Wt 148.1 lb

## 2023-07-06 DIAGNOSIS — N939 Abnormal uterine and vaginal bleeding, unspecified: Secondary | ICD-10-CM | POA: Insufficient documentation

## 2023-07-06 DIAGNOSIS — Z758 Other problems related to medical facilities and other health care: Secondary | ICD-10-CM

## 2023-07-06 DIAGNOSIS — Z603 Acculturation difficulty: Secondary | ICD-10-CM | POA: Insufficient documentation

## 2023-07-06 DIAGNOSIS — Z853 Personal history of malignant neoplasm of breast: Secondary | ICD-10-CM

## 2023-07-06 HISTORY — PX: ENDOMETRIAL BIOPSY: PRO73

## 2023-07-06 NOTE — Procedures (Signed)
Endometrial Biopsy Procedure Note  Pre-operative Diagnosis: AUB  Post-operative Diagnosis: same   Procedure Details  Cervical exam performed in the presence of a chaperone Urine pregnancy test was done and result was negative.  The risks (including infection, bleeding, pain, and uterine perforation) and benefits of the procedure were explained to the patient and Written informed consent was obtained.  The patient was placed in the dorsal lithotomy position.  Bimanual exam showed the uterus to be in the neutral position.  A Pederson speculum inserted in the vagina, and the cervix was visualized and a pap smear performed. The cervix was then prepped with povidone iodine, and a sharp tenaculum was applied to the anterior lip of the cervix for stabilization.  A pipelle was inserted into the uterine cavity and sounded the uterus to a depth of 5cm.  A Minimal amount of tissue was collected after 2 passes. The sample was sent for pathologic examination.  Condition: Stable  Complications: None  Plan: The patient was advised to call for any fever or for prolonged or severe pain or bleeding. She was advised to use OTC analgesics as needed for mild to moderate pain. She was advised to avoid vaginal intercourse for 48 hours or until the bleeding has completely stopped.  Cornelia Copa MD Attending Center for [employer] [facility])

## 2023-07-06 NOTE — Progress Notes (Signed)
Pt reports that she stopped bleeding today

## 2023-07-06 NOTE — Progress Notes (Signed)
Obstetrics and Gynecology New Patient Evaluation  Appointment Date: 07/06/2023  OBGYN Clinic: Center for [facility]   Primary Care Provider: Olegario Messier  Referring Provider: [facility]CP  Chief Complaint: AUB  History of Present Illness: Kathryn Cannon is a 52 y.o. Hispanic [id number] (Patient's last menstrual period was 06/22/2023 (exact date).), seen for the above chief complaint. 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  She was seen by [facility] 06/09/23 referred for an appointment today due to AUB. She had a pap smear three years ago that was cytology negative and HPV negative so she was told she didn't need another one.  She states that she was told that the Tamoxifen would likely take away her periods but she states they never took her periods away but she would go 3-6 months in between a period and her period would be a few days and not heavy or painful. She had her regular period in March and then no period until August where she's had multiple periods each month since August. No bleeding today and not really painful although she does endorse some RLQ discomfort. No hot flashes, night sweats.   She was last seen by [facility] in October 2023 and signed off and only needed SBEs and yearly mammograms  Review of Systems: Pertinent items noted in HPI and remainder of comprehensive ROS otherwise negative.   Patient Active Problem List   Diagnosis Date Noted   Language barrier 07/06/2023   Abnormal uterine bleeding (AUB) 07/06/2023   Pre-diabetes 03/27/2020   Hyperlipidemia 03/22/2019   Elevated blood pressure reading 03/22/2019   Screening breast examination 03/01/2019   Atypical ductal hyperplasia of left breast 06/13/2017    Past Medical History:  Past Medical History:  Diagnosis Date   Atypical ductal hyperplasia of left breast    Headache     Hyperlipidemia     Past Surgical History:  Past Surgical History:  Procedure Laterality Date   BREAST EXCISIONAL BIOPSY Left 2018   BREAST LUMPECTOMY WITH RADIOACTIVE SEED LOCALIZATION Left 04/27/2017   Procedure: RADIOCATIVE SEED GUIDED LEFT BREAST LUMPECTOMY, ERAS PATHWAY;  Surgeon: Griselda Miner, MD;  Location: [facility] OR;  Service: General;  Laterality: Left;   BREAST SURGERY     CESAREAN SECTION     3 previous   ENDOMETRIAL BIOPSY  07/06/2023    Past Obstetrical History:  OB History  Gravida Para Term Preterm AB Living  5 3 3   2 3   SAB IAB Ectopic Multiple Live Births  2       3    # Outcome Date GA Lbr Len/2nd Weight Sex Type Anes PTL Lv  5 SAB           4 SAB           3 Term           2 Term           1 Term             Past Gynecological History: As per HPI.  Social History:  Social History   Socioeconomic History   Marital status: Married    Spouse name: Not on file   Number of children: 3   Years of education: Not on file   Highest education level: 7th grade  Occupational History   Not on file  Tobacco Use   Smoking status: Never   Smokeless tobacco:  Never  Vaping Use   Vaping status: Never Used  Substance and Sexual Activity   Alcohol use: No   Drug use: No   Sexual activity: Yes    Birth control/protection: Condom  Other Topics Concern   Not on file  Social History Narrative   Not on file   Social Determinants of Health   Financial Resource Strain: Not on File (08/11/2022)   Received from [employer], [employer]    Financial Resource Strain: 0  Food Insecurity: No Food Insecurity (06/09/2023)   Hunger Vital Sign    Worried About Running Out of Food in the Last Year: Never true    Ran Out of Food in the Last Year: Never true  Transportation Needs: No Transportation Needs (06/09/2023)   PRAPARE - Administrator, Civil Service (Medical): No    Lack of Transportation (Non-Medical): No  Physical Activity: Not  on File (08/11/2022)   Received from [place], [place]   Physical Activity    Physical Activity: 0  Stress: Not on File (08/11/2022)   Received from [facility], [place]   Stress    Stress: 0  Social Connections: Not on File (05/12/2023)   Received from [facility]   Social Connections    Connectedness: 0  Intimate Partner Violence: Not At Risk (05/24/2022)   Humiliation, Afraid, Rape, and Kick questionnaire    Fear of Current or Ex-Partner: No    Emotionally Abused: No    Physically Abused: No    Sexually Abused: No    Family History:  Family History  Problem Relation Age of Onset   Diabetes Paternal Aunt    Breast cancer Neg Hx     Medications Lindora Mercado-Vences had no medications administered during this visit. Current Outpatient Medications  Medication Sig Dispense Refill   acetaminophen (TYLENOL) 500 MG tablet Take 1,000 mg by mouth every 8 (eight) hours as needed for headache.     fenofibrate (TRICOR) 48 MG tablet Take 1 tablet (48 mg total) by mouth daily. 90 tablet 3   Magnesium Oxide 500 MG CAPS Take 500 mg by mouth daily.     No current facility-administered medications for this visit.    Allergies Patient has no known allergies.   Physical Exam:  BP 127/75   Pulse 63   Ht 5\' 1"  (1.549 m)   Wt 148 lb 1.6 oz (67.2 kg)   LMP 06/22/2023 (Exact Date)   BMI 27.98 kg/m  Body mass index is 27.98 kg/m. General appearance: Well nourished, well developed female in no acute distress.  Cardiovascular: normal s1 and s2.  No murmurs, rubs or gallops. Respiratory:  Clear to auscultation bilateral. Normal respiratory effort Abdomen: positive bowel sounds and no masses, hernias; diffusely non tender to palpation, non distended Neuro/Psych:  Normal mood and affect.  Skin:  Warm and dry.  Lymphatic:  No inguinal lymphadenopathy.   Cervical exam performed in the presence of a chaperone Pelvic exam: is not limited by body habitus EGBUS: within normal limits, mild to moderate  atrophy Vagina: within normal limits and with no blood or discharge in the vault. Mild atrophy Cervix: normal appearing cervix without tenderness, discharge or lesions. Mild atrophy Uterus:  nonenlarged and non tender Adnexa:  normal adnexa and no mass, fullness, tenderness Rectovaginal: deferred  See procedure note for embx details  Laboratory: UPT negative  Radiology: none  Assessment: patient stable  Plan: 1. Abnormal uterine bleeding (AUB) RTC after u/s is back. May need hysteroscopy d&c  given her tamoxifen usage history - Cervicovaginal ancillary only( [place]) - Surgical pathology( [facility]/ [facility]) - Cytology - PAP( [place]) - CBC - TSH Rfx on Abnormal to Free T4 - Follicle stimulating hormone - Estradiol - US PELVIC COMPLETE WITH TRANSVAGINAL; Future  2. Language barrier In person interpreter used  3. History of breast cancer  Future Appointments  Date Time Provider Department Center  07/13/2023  2:30 PM [facility] 2 [facility]  08/23/2023  9:35 AM  Bing, MD [facility]    Cornelia Copa MD Attending Center for [facility])

## 2023-07-07 LAB — CBC
Hematocrit: 39.8 % (ref 34.0–46.6)
Hemoglobin: 13.2 g/dL (ref 11.1–15.9)
MCH: 30.7 pg (ref 26.6–33.0)
MCHC: 33.2 g/dL (ref 31.5–35.7)
MCV: 93 fL (ref 79–97)
Platelets: 371 10*3/uL (ref 150–450)
RBC: 4.3 x10E6/uL (ref 3.77–5.28)
RDW: 11.9 % (ref 11.7–15.4)
WBC: 5.7 10*3/uL (ref 3.4–10.8)

## 2023-07-07 LAB — FOLLICLE STIMULATING HORMONE: FSH: 72.2 m[IU]/mL

## 2023-07-07 LAB — CERVICOVAGINAL ANCILLARY ONLY
Bacterial Vaginitis (gardnerella): NEGATIVE
Candida Glabrata: NEGATIVE
Candida Vaginitis: NEGATIVE
Chlamydia: NEGATIVE
Comment: NEGATIVE
Comment: NEGATIVE
Comment: NEGATIVE
Comment: NEGATIVE
Comment: NEGATIVE
Comment: NORMAL
Neisseria Gonorrhea: NEGATIVE
Trichomonas: NEGATIVE

## 2023-07-07 LAB — TSH RFX ON ABNORMAL TO FREE T4: TSH: 1.48 u[IU]/mL (ref 0.450–4.500)

## 2023-07-07 LAB — ESTRADIOL: Estradiol: <5 pg/mL

## 2023-07-08 LAB — SURGICAL PATHOLOGY

## 2023-07-11 LAB — CYTOLOGY - PAP
Chlamydia: NEGATIVE
Comment: NEGATIVE
Comment: NEGATIVE
Comment: NEGATIVE
Comment: NORMAL
Diagnosis: NEGATIVE
High risk HPV: NEGATIVE
Neisseria Gonorrhea: NEGATIVE
Trichomonas: NEGATIVE

## 2023-07-13 ENCOUNTER — Ambulatory Visit (HOSPITAL_COMMUNITY)
Admission: RE | Admit: 2023-07-13 | Discharge: 2023-07-13 | Disposition: A | Discharge status: Home / Self Care | Payer: Self-pay | Source: Ambulatory Visit | Attending: Obstetrics and Gynecology | Admitting: Obstetrics and Gynecology

## 2023-07-13 DIAGNOSIS — N939 Abnormal uterine and vaginal bleeding, unspecified: Secondary | ICD-10-CM | POA: Insufficient documentation

## 2023-07-15 IMAGING — MG MM DIGITAL SCREENING BILAT W/ TOMO AND CAD
6 of 10 series · 6 of 30 positions shown · non-contrast
Comparison: Previous exam(s).

CLINICAL DATA: Screening.

EXAM:
DIGITAL SCREENING BILATERAL MAMMOGRAM WITH TOMOSYNTHESIS AND CAD
TECHNIQUE: Bilateral screening digital craniocaudal and mediolateral oblique
mammograms were obtained. Bilateral screening digital breast
tomosynthesis was performed. The images were evaluated with
computer-aided detection.

[L CC synth-2D]
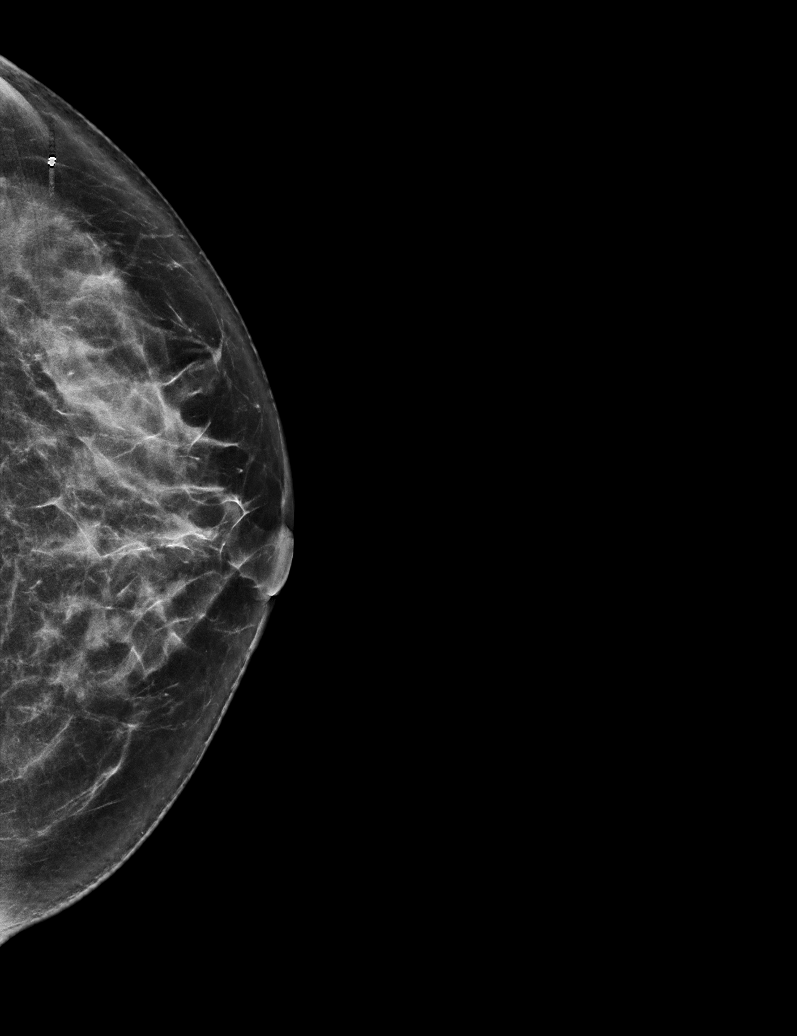

[R MLO synth-2D]
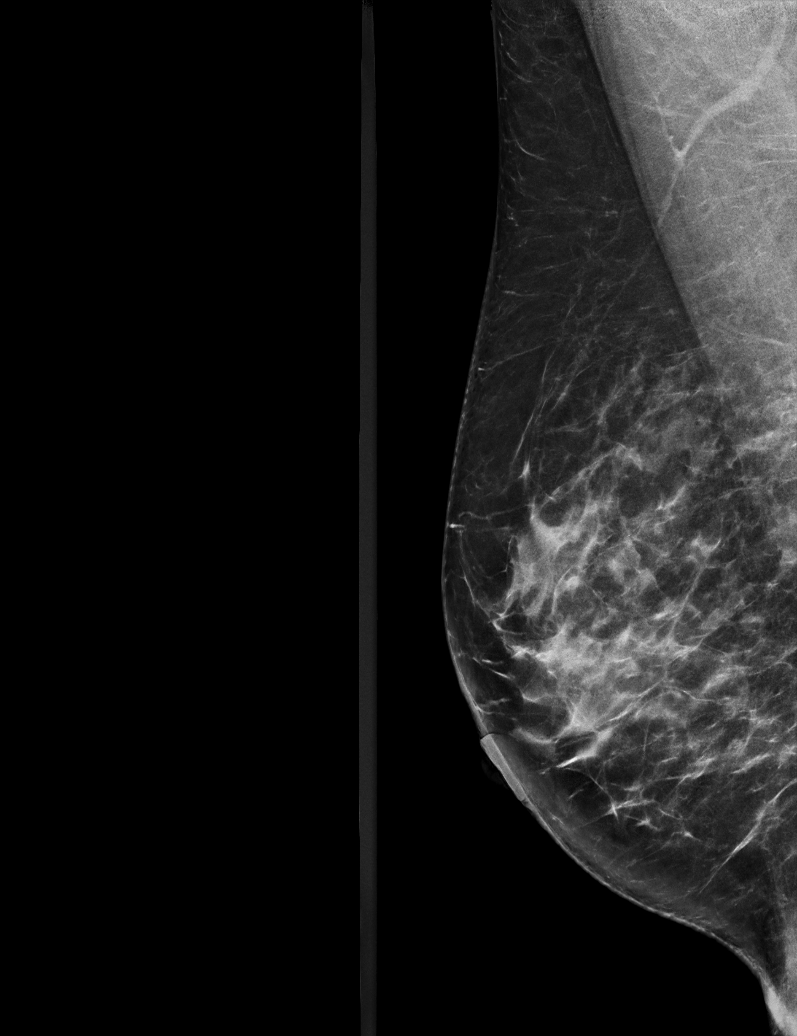

[L MLO synth-2D]
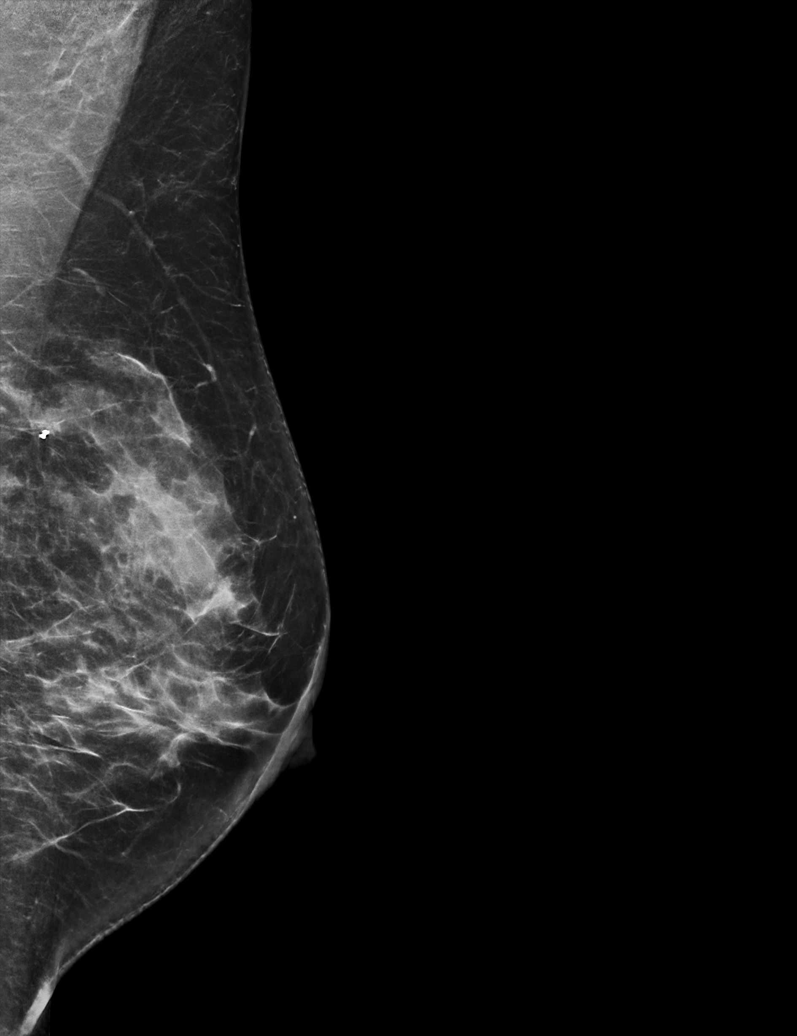

[R CC synth-2D]
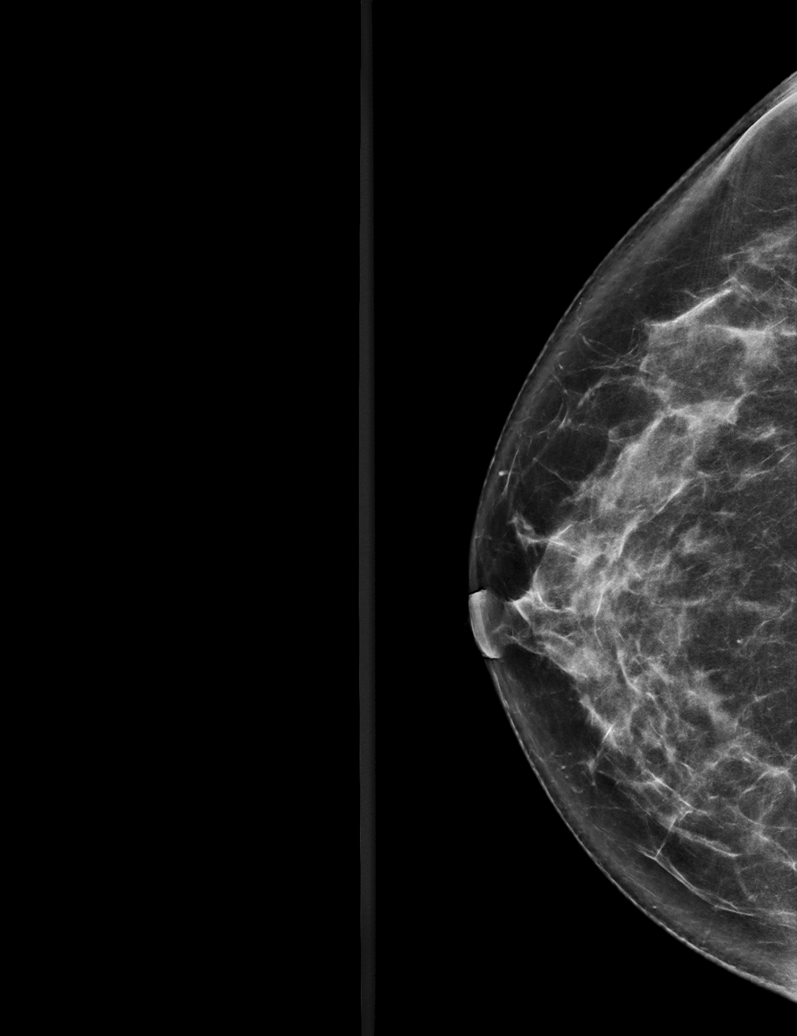

[L XCCL synth-2D]
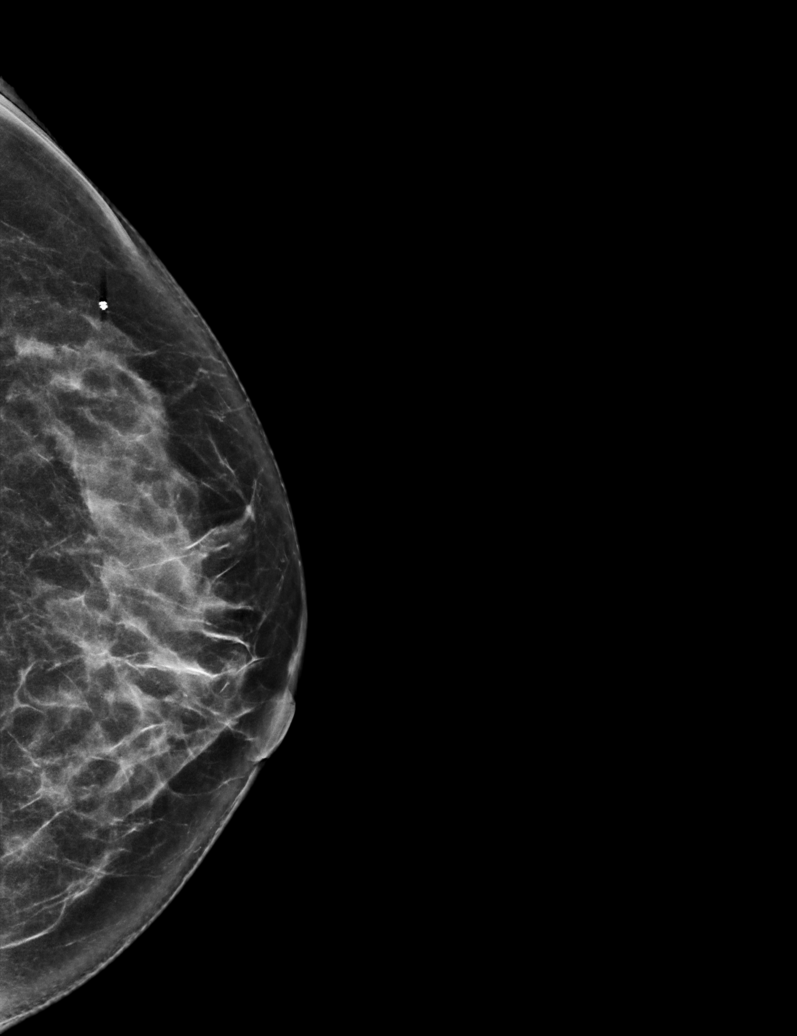

[L CC tomo · tomo slice 35/68.0]
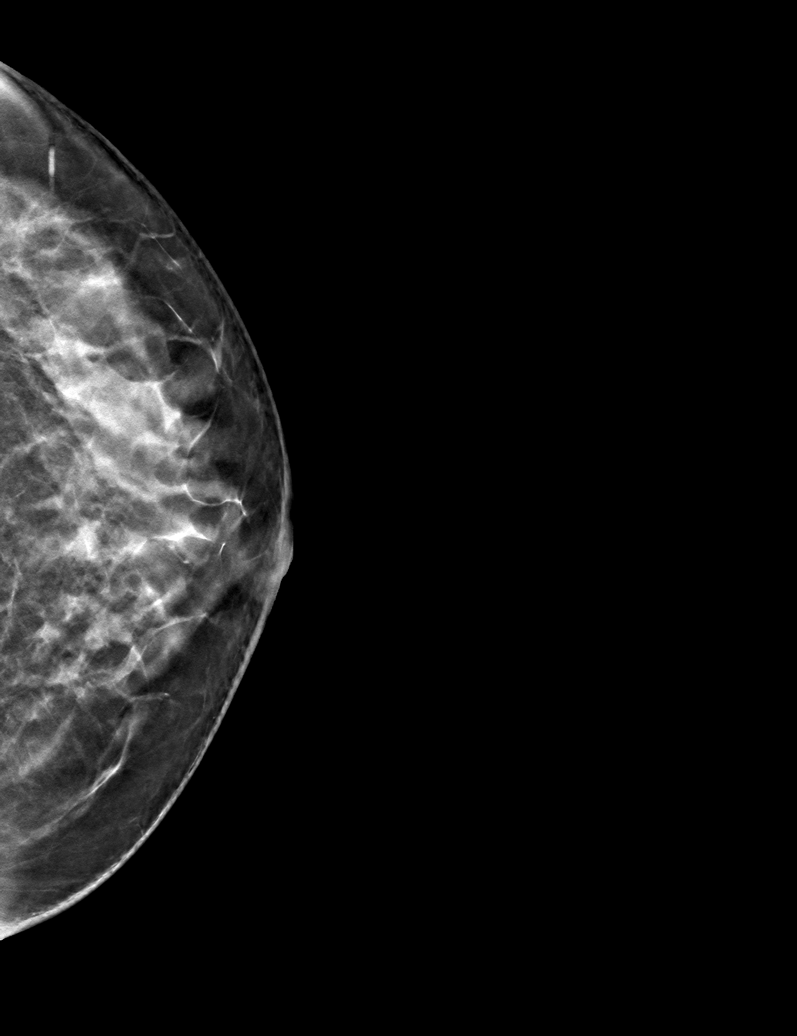

[6 of 30 positions shown; findings below may reference images not displayed]

ACR Breast Density Category c: The breast tissue is heterogeneously
dense, which may obscure small masses.
FINDINGS: There are no findings suspicious for malignancy.
IMPRESSION: No mammographic evidence of malignancy. A result letter of this
screening mammogram will be mailed directly to the patient.

RECOMMENDATION:
Screening mammogram in one year. (Code:Q3-W-BC3)

BI-RADS CATEGORY  1: Negative.

## 2023-08-23 ENCOUNTER — Ambulatory Visit: Payer: Self-pay | Admitting: Obstetrics and Gynecology

## 2023-08-23 ENCOUNTER — Encounter: Payer: Self-pay | Admitting: Obstetrics and Gynecology

## 2023-08-23 VITALS — BP 143/84 | HR 71 | Wt 156.2 lb

## 2023-08-23 DIAGNOSIS — Z603 Acculturation difficulty: Secondary | ICD-10-CM

## 2023-08-23 DIAGNOSIS — Z8742 Personal history of other diseases of the female genital tract: Secondary | ICD-10-CM

## 2023-08-23 DIAGNOSIS — N939 Abnormal uterine and vaginal bleeding, unspecified: Secondary | ICD-10-CM

## 2023-08-23 DIAGNOSIS — Z758 Other problems related to medical facilities and other health care: Secondary | ICD-10-CM

## 2023-08-23 DIAGNOSIS — Z9229 Personal history of other drug therapy: Secondary | ICD-10-CM | POA: Insufficient documentation

## 2023-08-23 DIAGNOSIS — Z1331 Encounter for screening for depression: Secondary | ICD-10-CM

## 2023-08-23 NOTE — Progress Notes (Signed)
 Obstetrics and Gynecology Established Patient Evaluation  Appointment Date: 08/23/2023  OBGYN Clinic: [facility] for [facility]   Primary Care Provider: Nooruddin, Saad  Chief Complaint: follow up AUB/PMB with h/o tamoxifen  use   History of Present Illness: Kathryn Cannon is a 53 y.o. Hispanic [id number] (No LMP recorded. (Menstrual status: Irregular Periods).), seen for the above chief complaint. Her past medical history is significant for 2018 left breast atypical ductal hyperplasia s/p lumpectomy followed by Tamoxifen  (2018-Late Oct 2023).   Patient seen by me as BCCCP referral, for a new patient visit, on 11/20.   She was seen by [facility] 06/09/23 referred for an appointment today due to AUB. She had a pap smear three years ago that was cytology negative and HPV negative so she was told she didn't need another one.   She states that she was told that the Tamoxifen  would likely take away her periods but she states they never took her periods away but she would go 3-6 months in between a period and her period would be a few days and not heavy or painful. She had her regular period in March and then no period until August where she's had multiple periods each month since August. No bleeding today and not really painful although she does endorse some RLQ discomfort. No hot flashes, night sweats.    She was last seen by [facility] in October 2023 and signed off and only needed SBEs and yearly mammograms  At her 11/20 visit, she had a pap, embx, u/s ordered, labs done.   11/27 u/s showed a uterus that was 9.5cm. ES 5.67mm wnl Her 11/20 blood work confirmed menopausal status with E2 undetectable, FSH >70, and a normal tsh/cbc/vag swab. EMBx showed scant inactive endometrium with abundant blood and pap cytology and HPV negative  Interval History: since that visit, she states her bleeding stopped on the day of her u/s and none since except for the last few days where she's  had some spotting and scant bleeding BRB.   Review of Systems: Pertinent items are noted in HPI.   Past Medical History:  Past Medical History:  Diagnosis Date   Atypical ductal hyperplasia of left breast    Headache    Hyperlipidemia     Past Surgical History:  Past Surgical History:  Procedure Laterality Date   BREAST EXCISIONAL BIOPSY Left 2018   BREAST LUMPECTOMY WITH RADIOACTIVE SEED LOCALIZATION Left 04/27/2017   Procedure: RADIOCATIVE SEED GUIDED LEFT BREAST LUMPECTOMY, ERAS PATHWAY;  Surgeon: Curvin Deward MOULD, MD;  Location: [facility] OR;  Service: General;  Laterality: Left;   BREAST SURGERY     CESAREAN SECTION     3 previous   ENDOMETRIAL BIOPSY  07/06/2023    Past Obstetrical History:  OB History  Gravida Para Term Preterm AB Living  5 3 3  2 3   SAB IAB Ectopic Multiple Live Births  2    3    # Outcome Date GA Lbr Len/2nd Weight Sex Type Anes PTL Lv  5 SAB           4 SAB           3 Term           2 Term           1 Term             Past Gynecological History: As per HPI.  Social History:  Social History   Socioeconomic History   Marital  status: Married    Spouse name: Not on file   Number of children: 3   Years of education: Not on file   Highest education level: 7th grade  Occupational History   Not on file  Tobacco Use   Smoking status: Never   Smokeless tobacco: Never  Vaping Use   Vaping status: Never Used  Substance and Sexual Activity   Alcohol use: No   Drug use: No   Sexual activity: Yes    Birth control/protection: Condom  Other Topics Concern   Not on file  Social History Narrative   Not on file   Social Drivers of Health   Financial Resource Strain: Not on File (08/11/2022)   Received from [employer] [employer]    Financial Resource Strain: 0  Food Insecurity: No Food Insecurity (06/09/2023)   Hunger Vital Sign    Worried About Running Out of Food in the Last Year: Never true    Ran Out of Food in the Last  Year: Never true  Transportation Needs: No Transportation Needs (06/09/2023)   PRAPARE - Administrator, Civil Service (Medical): No    Lack of Transportation (Non-Medical): No  Physical Activity: Not on File (08/11/2022)   Received from [place], [place]   Physical Activity    Physical Activity: 0  Stress: Not on File (08/11/2022)   Received from [facility], [place]   Stress    Stress: 0  Social Connections: Not on File (05/12/2023)   Received from [facility]   Social Connections    Connectedness: 0  Intimate Partner Violence: Not At Risk (05/24/2022)   Humiliation, Afraid, Rape, and Kick questionnaire    Fear of Current or Ex-Partner: No    Emotionally Abused: No    Physically Abused: No    Sexually Abused: No    Family History:  Family History  Problem Relation Age of Onset   Diabetes Paternal Aunt    Breast cancer Neg Hx     Medications Kathryn Cannon had no medications administered during this visit. Current Outpatient Medications  Medication Sig Dispense Refill   acetaminophen  (TYLENOL ) 500 MG tablet Take 1,000 mg by mouth every 8 (eight) hours as needed for headache.     fenofibrate  (TRICOR ) 48 MG tablet Take 1 tablet (48 mg total) by mouth daily. 90 tablet 3   Magnesium Oxide 500 MG CAPS Take 500 mg by mouth daily.     No current facility-administered medications for this visit.    Allergies Patient has no known allergies.   Physical Exam:  BP (!) 143/84   Pulse 71   Wt 156 lb 3.2 oz (70.9 kg)   BMI 29.51 kg/m  Body mass index is 29.51 kg/m. General appearance: Well nourished, well developed female in no acute distress.  Respiratory: Normal respiratory effort Neuro/Psych:  Normal mood and affect.   Laboratory: as per HPI  Radiology: as per HPI Narrative & Impression  CLINICAL DATA:  Abnormal uterine bleeding   EXAM: TRANSABDOMINAL AND TRANSVAGINAL ULTRASOUND OF PELVIS   TECHNIQUE: Both transabdominal and transvaginal ultrasound examinations  of the pelvis were performed. Transabdominal technique was performed for global imaging of the pelvis including uterus, ovaries, adnexal regions, and pelvic cul-de-sac. It was necessary to proceed with endovaginal exam following the transabdominal exam to visualize the endometrium.   COMPARISON:  None Available.   FINDINGS: Uterus   Measurements: 9.5 x 4.5 x 6.8 cm = volume: 151.6 mL. There is a 1.6 x 1.4 x  2.0 cm fibroid posterior left uterine fundus.   Endometrium   Thickness: 5.8 mm.  No focal abnormality visualized.   Right ovary   Measurements: 1.7 x 1.2 x 1.0 cm = volume: 1.1 mL. Normal appearance/no adnexal mass.   Left ovary   Measurements: 1.7 x 0.7 x 1.4 cm = volume: 0.8 mL. Normal appearance/no adnexal mass.   Other findings   No abnormal free fluid.   IMPRESSION: 1. Small uterine fibroid. 2. Endometrium measures 5.8 mm. If bleeding remains unresponsive to hormonal or medical therapy, sonohysterogram should be considered for focal lesion work-up. (Ref: Radiological Reasoning: Algorithmic Workup of Abnormal Vaginal Bleeding with Endovaginal Sonography and Sonohysterography. AJR 2008; [id number]:D31-26)     Electronically Signed   By: Bard Moats M.D.   On: 07/13/2023 22:34   Assessment: patient stable  Plan: I d/w her that given her s/s and history, I recommend going to the OR for a hysteroscopy. R/b/a d/w her and she is amenable to proceeding. Request sent to OR scheduling  In person interpreter used  RTC post op   Bebe Izell Raddle MD Attending Center for [employer] [facility])

## 2023-09-06 ENCOUNTER — Other Ambulatory Visit: Payer: Self-pay | Admitting: Obstetrics and Gynecology

## 2023-09-06 ENCOUNTER — Telehealth: Payer: Self-pay

## 2023-09-06 ENCOUNTER — Encounter (HOSPITAL_BASED_OUTPATIENT_CLINIC_OR_DEPARTMENT_OTHER): Payer: Self-pay | Admitting: Obstetrics and Gynecology

## 2023-09-06 DIAGNOSIS — Z01818 Encounter for other preprocedural examination: Secondary | ICD-10-CM

## 2023-09-06 NOTE — Telephone Encounter (Signed)
Used interpreter services to call patient(Kathryn Cannon #id [phone]) to see if she was available for surgery with Dr. Vergie Living on 09/07/23? Patient states she is available and can arrive at [facility] at 8:30 am and surgery will begin at 1030. Pre-op instructions were provided by phone and patient confirmed understanding.

## 2023-09-06 NOTE — Progress Notes (Signed)
Spoke w/ via phone for pre-op interview--- pt thru [place] interpreter ID # [phone] Lab needs dos----   cmp, urine preg      Lab results------ no COVID test -----patient states asymptomatic no test needed Arrive at ------- 0830 on 09-07-2023 NPO after MN  Med rec completed Medications to take morning of surgery ----- none Diabetic medication ----- n/a Patient instructed no nail polish to be worn day of surgery Patient instructed to bring photo id and insurance card day of surgery Patient aware to have Driver (ride ) / caregiver    for 24 hours after surgery - husband , jose Patient Special Instructions ----- n/a Pre-Op special Instructions -----  office told patient to arrive at 0830.  Late add on.  Pt speaks spanish , she requested in person interpreter.  Pt verbalized understanding at this late time could not guarantee one to be available but that we had interpreter's available thru video and phone and that I would sent in request anyone just case one could be available post-op maybe.  Copy of request with chart.   Patient verbalized understanding of instructions that were given at this phone interview. Patient denies chest pain, sob, fever, cough at the interview.

## 2023-09-07 ENCOUNTER — Ambulatory Visit (HOSPITAL_BASED_OUTPATIENT_CLINIC_OR_DEPARTMENT_OTHER): Payer: No Typology Code available for payment source | Admitting: Anesthesiology

## 2023-09-07 ENCOUNTER — Ambulatory Visit (HOSPITAL_BASED_OUTPATIENT_CLINIC_OR_DEPARTMENT_OTHER)
Admission: RE | Admit: 2023-09-07 | Discharge: 2023-09-07 | Disposition: A | Discharge status: Home / Self Care | Payer: No Typology Code available for payment source | Attending: Obstetrics and Gynecology | Admitting: Obstetrics and Gynecology

## 2023-09-07 ENCOUNTER — Encounter (HOSPITAL_BASED_OUTPATIENT_CLINIC_OR_DEPARTMENT_OTHER): Payer: Self-pay | Admitting: Obstetrics and Gynecology

## 2023-09-07 ENCOUNTER — Other Ambulatory Visit: Payer: Self-pay

## 2023-09-07 ENCOUNTER — Encounter (HOSPITAL_BASED_OUTPATIENT_CLINIC_OR_DEPARTMENT_OTHER)
Admission: RE | Disposition: A | Discharge status: Home / Self Care | Payer: Self-pay | Source: Home / Self Care | Attending: Obstetrics and Gynecology

## 2023-09-07 DIAGNOSIS — N950 Postmenopausal bleeding: Secondary | ICD-10-CM | POA: Insufficient documentation

## 2023-09-07 DIAGNOSIS — N840 Polyp of corpus uteri: Secondary | ICD-10-CM | POA: Insufficient documentation

## 2023-09-07 DIAGNOSIS — Z79810 Long term (current) use of selective estrogen receptor modulators (SERMs): Secondary | ICD-10-CM | POA: Insufficient documentation

## 2023-09-07 DIAGNOSIS — Z9889 Other specified postprocedural states: Secondary | ICD-10-CM

## 2023-09-07 DIAGNOSIS — Z853 Personal history of malignant neoplasm of breast: Secondary | ICD-10-CM | POA: Insufficient documentation

## 2023-09-07 DIAGNOSIS — Z01818 Encounter for other preprocedural examination: Secondary | ICD-10-CM

## 2023-09-07 DIAGNOSIS — R9389 Abnormal findings on diagnostic imaging of other specified body structures: Secondary | ICD-10-CM

## 2023-09-07 DIAGNOSIS — D259 Leiomyoma of uterus, unspecified: Secondary | ICD-10-CM | POA: Insufficient documentation

## 2023-09-07 HISTORY — PX: HYSTEROSCOPY WITH D & C: SHX1775

## 2023-09-07 HISTORY — DX: Prediabetes: R73.03

## 2023-09-07 HISTORY — DX: Abnormal uterine and vaginal bleeding, unspecified: N93.9

## 2023-09-07 HISTORY — DX: Personal history of other drug therapy: Z92.29

## 2023-09-07 LAB — COMPREHENSIVE METABOLIC PANEL
ALT: 33 U/L (ref 0–44)
AST: 34 U/L (ref 15–41)
Albumin: 4.3 g/dL (ref 3.5–5.0)
Alkaline Phosphatase: 38 U/L (ref 38–126)
Anion gap: 8 (ref 5–15)
BUN: 12 mg/dL (ref 6–20)
CO2: 25 mmol/L (ref 22–32)
Calcium: 9.4 mg/dL (ref 8.9–10.3)
Chloride: 106 mmol/L (ref 98–111)
Creatinine, Ser: 0.53 mg/dL (ref 0.44–1.00)
GFR, Estimated: >60 mL/min (ref >60)
Glucose, Bld: 110 mg/dL — ABNORMAL HIGH (ref 70–99)
Potassium: 3.8 mmol/L (ref 3.5–5.1)
Sodium: 139 mmol/L (ref 135–145)
Total Bilirubin: 0.7 mg/dL (ref 0.0–1.2)
Total Protein: 7.4 g/dL (ref 6.5–8.1)

## 2023-09-07 LAB — POCT PREGNANCY, URINE: Preg Test, Ur: NEGATIVE

## 2023-09-07 SURGERY — DILATATION AND CURETTAGE /HYSTEROSCOPY
Anesthesia: General | Site: Uterus

## 2023-09-07 MED ORDER — ACETAMINOPHEN 500 MG PO TABS
ORAL_TABLET | ORAL | Status: AC
  Filled 2023-09-07: qty 2

## 2023-09-07 MED ORDER — LACTATED RINGERS IV SOLN: INTRAVENOUS | Status: DC

## 2023-09-07 MED ORDER — LIDOCAINE HCL (CARDIAC) PF 100 MG/5ML IV SOSY
PREFILLED_SYRINGE | INTRAVENOUS | Status: DC | PRN
  Administered 2023-09-07: 50 mg via INTRAVENOUS

## 2023-09-07 MED ORDER — ACETAMINOPHEN 500 MG PO TABS
1000.0000 mg | ORAL_TABLET | Freq: Once | ORAL | Status: AC
  Administered 2023-09-07: 1000 mg via ORAL

## 2023-09-07 MED ORDER — HEATING PADS PADS: 1.0000 [IU] | MEDICATED_PAD | Status: DC | PRN

## 2023-09-07 MED ORDER — MIDAZOLAM HCL 2 MG/2ML IJ SOLN
INTRAMUSCULAR | Status: AC
  Filled 2023-09-07: qty 2

## 2023-09-07 MED ORDER — ONDANSETRON HCL 4 MG/2ML IJ SOLN
INTRAMUSCULAR | Status: DC | PRN
  Administered 2023-09-07: 4 mg via INTRAVENOUS

## 2023-09-07 MED ORDER — IBUPROFEN 600 MG PO TABS: 600.0000 mg | ORAL_TABLET | Freq: Four times a day (QID) | ORAL | Status: DC | PRN

## 2023-09-07 MED ORDER — FENTANYL CITRATE (PF) 100 MCG/2ML IJ SOLN: 25.0000 ug | INTRAMUSCULAR | Status: DC | PRN

## 2023-09-07 MED ORDER — SODIUM CHLORIDE 0.9 % IR SOLN
Status: DC | PRN
  Administered 2023-09-07: 3000 mL

## 2023-09-07 MED ORDER — PROPOFOL 10 MG/ML IV BOLUS
INTRAVENOUS | Status: DC | PRN
  Administered 2023-09-07: 50 mg via INTRAVENOUS
  Administered 2023-09-07: 150 mg via INTRAVENOUS

## 2023-09-07 MED ORDER — DEXMEDETOMIDINE HCL IN NACL 80 MCG/20ML IV SOLN
INTRAVENOUS | Status: DC | PRN
  Administered 2023-09-07: 4 ug via INTRAVENOUS

## 2023-09-07 MED ORDER — KETOROLAC TROMETHAMINE 30 MG/ML IJ SOLN
INTRAMUSCULAR | Status: DC | PRN
  Administered 2023-09-07: 30 mg via INTRAVENOUS

## 2023-09-07 MED ORDER — DROPERIDOL 2.5 MG/ML IJ SOLN: 0.6250 mg | Freq: Once | INTRAMUSCULAR | Status: DC | PRN

## 2023-09-07 MED ORDER — LIDOCAINE HCL 1 % IJ SOLN
INTRAMUSCULAR | Status: DC | PRN
  Administered 2023-09-07: 20 mL

## 2023-09-07 MED ORDER — DEXAMETHASONE SODIUM PHOSPHATE 4 MG/ML IJ SOLN
INTRAMUSCULAR | Status: DC | PRN
  Administered 2023-09-07: 8 mg via INTRAVENOUS

## 2023-09-07 MED ORDER — FENTANYL CITRATE (PF) 100 MCG/2ML IJ SOLN
INTRAMUSCULAR | Status: AC
  Filled 2023-09-07: qty 2

## 2023-09-07 MED ORDER — OXYCODONE HCL 5 MG/5ML PO SOLN: 5.0000 mg | Freq: Once | ORAL | Status: DC | PRN

## 2023-09-07 MED ORDER — FENTANYL CITRATE (PF) 100 MCG/2ML IJ SOLN
INTRAMUSCULAR | Status: DC | PRN
  Administered 2023-09-07: 50 ug via INTRAVENOUS

## 2023-09-07 MED ORDER — MIDAZOLAM HCL 2 MG/2ML IJ SOLN
INTRAMUSCULAR | Status: DC | PRN
  Administered 2023-09-07: 2 mg via INTRAVENOUS

## 2023-09-07 MED ORDER — OXYCODONE HCL 5 MG PO TABS: 5.0000 mg | ORAL_TABLET | Freq: Once | ORAL | Status: DC | PRN

## 2023-09-07 SURGICAL SUPPLY — 24 items
CATH ROBINSON RED A/P 16FR (CATHETERS) ×1 IMPLANT
DEVICE MYOSURE LITE (MISCELLANEOUS) IMPLANT
DEVICE MYOSURE REACH (MISCELLANEOUS) IMPLANT
DILATOR CANAL MILEX (MISCELLANEOUS) IMPLANT
DRSG TELFA 3X8 NADH STRL (GAUZE/BANDAGES/DRESSINGS) ×1 IMPLANT
GAUZE 4X4 16PLY ~~LOC~~+RFID DBL (SPONGE) ×2 IMPLANT
GLOVE BIOGEL PI IND STRL 6 (GLOVE) IMPLANT
GLOVE BIOGEL PI IND STRL 7.0 (GLOVE) IMPLANT
GLOVE BIOGEL PI IND STRL 7.5 (GLOVE) ×1 IMPLANT
GLOVE SURG SS PI 7.0 STRL IVOR (GLOVE) ×1 IMPLANT
GOWN STRL REUS W/TWL LRG LVL3 (GOWN DISPOSABLE) IMPLANT
GOWN STRL REUS W/TWL XL LVL3 (GOWN DISPOSABLE) ×1 IMPLANT
IV NS IRRIG 3000ML ARTHROMATIC (IV SOLUTION) IMPLANT
KIT PROCEDURE FLUENT (KITS) ×1 IMPLANT
MYOSURE XL FIBROID (MISCELLANEOUS)
PACK VAGINAL MINOR WOMEN LF (CUSTOM PROCEDURE TRAY) ×1 IMPLANT
PAD OB MATERNITY 4.3X12.25 (PERSONAL CARE ITEMS) ×1 IMPLANT
PAD PREP 24X48 CUFFED NSTRL (MISCELLANEOUS) ×1 IMPLANT
SEAL ROD LENS SCOPE MYOSURE (ABLATOR) ×1 IMPLANT
SUT VIC AB 2-0 SH 27XBRD (SUTURE) IMPLANT
SUT VIC AB 3-0 CT1 36 (SUTURE) IMPLANT
SYSTEM TISS REMOVAL MYOSURE XL (MISCELLANEOUS) IMPLANT
TOWEL OR 17X24 6PK STRL BLUE (TOWEL DISPOSABLE) ×1 IMPLANT
WATER STERILE IRR 500ML POUR (IV SOLUTION) IMPLANT

## 2023-09-07 NOTE — Op Note (Signed)
Operative Note   09/07/2023  PRE-OP DIAGNOSIS: post menopausal bleeding. Thickened endometrial stripe. History of tamoxifen use   POST-OP DIAGNOSIS: same   SURGEON: Surgeons and Role:    Potrero Bing, MD - Primary  ASSISTANT: none  PROCEDURE: Hysteroscopy, dilation and curettage  ANESTHESIA: General and paracervical block  ESTIMATED BLOOD LOSS: 5mL  DRAINS: none   TOTAL IV FLUIDS: per OR note  SPECIMENS: endometrial curettings  VTE PROPHYLAXIS: SCDs to the bilateral lower extremities  ANTIBIOTICS: not indicated  FLUID DEFICIT: minimal  COMPLICATIONS: none  DISPOSITION: PACU - hemodynamically stable.  CONDITION: stable  FINDINGS: Exam under anesthesia revealed small, mobile  uterus with no masses and bilateral adnexa without masses or fullness, large, patulous cervix Patient sounded to 9cm. Hysteroscopy revealed a grossly normal appearing atrophic appearing uterine cavity and cervical canal; no tubal ostia seen in their respective areas. Moderate amount of curettings. No evidence of perforation on repeat hysteroscopy  PROCEDURE IN DETAIL:  After informed consent was obtained, the patient was taken to the operating room where anesthesia was obtained without difficulty. The patient was positioned in the dorsal lithotomy position in Chandler stirrups.  The patient's bladder was catheterized with an in and out foley catheter.  The patient was examined under anesthesia, with the above noted findings.  The bi-valved speculum was placed inside the patient's vagina, and the the anterior lip of the cervix was seen and grasped with the tenaculum.  A paracervical block was achieved with 20mL 1% lidocaine.  The uterine cavity was sounded to 9cm, and then the cervix was already dilated to fit the hysteroscope. The hysteroscope was introduced, with the above noted findings. The hystersocope was removed and the uterine cavity was curetted until a gritty texture was noted, yielding moderate  amount of endometrial curettings.  Excellent hemostasis was noted, and all instruments were removed, with excellent hemostasis noted throughout.  She was then taken out of dorsal lithotomy. The patient tolerated the procedure well.  Sponge, lap and instrument counts were correct x2.  The patient was taken to recovery room in excellent condition.  Cornelia Copa MD Attending Center for [facility])

## 2023-09-07 NOTE — Transfer of Care (Signed)
Immediate Anesthesia Transfer of Care Note  Patient: Kathryn Cannon  Procedure(s) Performed: DILATATION AND CURETTAGE /HYSTEROSCOPY (Uterus)  Patient Location: PACU  Anesthesia Type:General  Level of Consciousness: drowsy and patient cooperative  Airway & Oxygen Therapy: Patient Spontanous Breathing and Patient connected to nasal cannula oxygen  Post-op Assessment: Report given to RN and Post -op Vital signs reviewed and stable  Post vital signs: Reviewed and stable  Last Vitals:  Vitals Value Taken Time  BP 119/72 09/07/23 1033  Temp    Pulse 67 09/07/23 1042  Resp 15 09/07/23 1042  SpO2 100 % 09/07/23 1042  Vitals shown include unfiled device data.  Last Pain:  Vitals:   09/07/23 0809  TempSrc: Oral  PainSc: 0-No pain      Patients Stated Pain Goal: 6 (09/07/23 0809)  Complications: No notable events documented.

## 2023-09-07 NOTE — H&P (Signed)
Obstetrics and Gynecology H&P   Surgery Date: 09/07/2023   OBGYN Clinic: [facility]    Primary Care Provider: Olegario Messier   Chief Complaint: Pre Op for hysteroscopy, d&c for AUB/PMB with h/o tamoxifen use   History of Present Illness: Kathryn Cannon is a 53 y.o. Hispanic [id number] (No LMP recorded. (Menstrual status: Irregular Periods).), seen for the above chief complaint. 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    Patient seen by me as B[facility]CP referral, for a new patient visit, on 11/20.    She was seen by [facility] 06/09/23 referred for an appointment today due to AUB. She had a pap smear three years ago that was cytology negative and HPV negative so she was told she didn't need another one.   She states that she was told that the Tamoxifen would likely take away her periods but she states they never took her periods away but she would go 3-6 months in between a period and her period would be a few days and not heavy or painful. She had her regular period in March and then no period until August where she's had multiple periods each month since August. No bleeding today and not really painful although she does endorse some RLQ discomfort. No hot flashes, night sweats.    She was last seen by Heme Onc in October 2023 and signed off and only needed SBEs and yearly mammograms   At her 11/20 visit, she had a pap, embx, u/s ordered, labs done.    11/27 u/s showed a uterus that was 9.5cm. ES 5.86mm wnl Her 11/20 blood work confirmed menopausal status with E2 undetectable, FSH >70, and a normal tsh/cbc/vag swab. EMBx showed scant inactive endometrium with abundant blood and pap cytology and HPV negative   Interval History: since that visit, she states her bleeding stopped on the day of her u/s and none since except for the last few days where  she's had some spotting and scant bleeding BRB.    Review of Systems: Pertinent items are noted in HPI.    Past Medical History:      Past Medical History:  Diagnosis Date   Atypical ductal hyperplasia of left breast     Headache     Hyperlipidemia            Past Surgical History:       Past Surgical History:  Procedure Laterality Date   BREAST EXCISIONAL BIOPSY Left 2018   BREAST LUMPECTOMY WITH RADIOACTIVE SEED LOCALIZATION Left 04/27/2017    Procedure: RADIOCATIVE SEED GUIDED LEFT BREAST LUMPECTOMY, ERAS PATHWAY;  Surgeon: Griselda Miner, MD;  Location: [facility] OR;  Service: General;  Laterality: Left;   BREAST SURGERY       CESAREAN SECTION        3 previous   ENDOMETRIAL BIOPSY   07/06/2023          Past Obstetrical History:                   OB History  Gravida Para Term Preterm AB Living   5 3 3   2 3    SAB IAB Ectopic Multiple Live Births      2       3         # Outcome Date GA Lbr Len/2nd Weight Sex Type Anes PTL Lv  5 SAB  4 SAB                    3 Term                    2 Term                    1 Term                        Past Gynecological History: As per HPI.   Social History:  Social History         Socioeconomic History   Marital status: Married      Spouse name: Not on file   Number of children: 3   Years of education: Not on file   Highest education level: 7th grade  Occupational History   Not on file  Tobacco Use   Smoking status: Never   Smokeless tobacco: Never  Vaping Use   Vaping status: Never Used  Substance and Sexual Activity   Alcohol use: No   Drug use: No   Sexual activity: Yes      Birth control/protection: Condom  Other Topics Concern   Not on file  Social History Narrative   Not on file    Social Drivers of Health        Financial Resource Strain: Not on File (08/11/2022)    Received from [employer] [employer]     Financial Resource Strain: 0  Food Insecurity: No  Food Insecurity (06/09/2023)    Hunger Vital Sign     Worried About Running Out of Food in the Last Year: Never true     Ran Out of Food in the Last Year: Never true  Transportation Needs: No Transportation Needs (06/09/2023)    PRAPARE - Therapist, art (Medical): No     Lack of Transportation (Non-Medical): No  Physical Activity: Not on File (08/11/2022)    Received from [place], [place]    Physical Activity     Physical Activity: 0  Stress: Not on File (08/11/2022)    Received from [facility], [place]    Stress     Stress: 0  Social Connections: Not on File (05/12/2023)    Received from [facility]    Social Connections     Connectedness: 0  Intimate Partner Violence: Not At Risk (05/24/2022)    Humiliation, Afraid, Rape, and Kick questionnaire     Fear of Current or Ex-Partner: No     Emotionally Abused: No     Physically Abused: No     Sexually Abused: No      Family History:       Family History  Problem Relation Age of Onset   Diabetes Paternal Aunt     Breast cancer Neg Hx            Medications Tacie Cannon had no medications administered during this visit.       Current Outpatient Medications  Medication Sig Dispense Refill   acetaminophen (TYLENOL) 500 MG tablet Take 1,000 mg by mouth every 8 (eight) hours as needed for headache.       fenofibrate (TRICOR) 48 MG tablet Take 1 tablet (48 mg total) by mouth daily. 90 tablet 3   Magnesium Oxide 500 MG CAPS Take 500 mg by mouth daily.          No current facility-administered medications for  this visit.        Allergies Patient has no known allergies.     Physical Exam:  Today's Vitals   09/06/23 1805 09/07/23 0809  BP:  (!) 145/77  Pulse:  61  Resp:  16  Temp:  97.9 F (36.6 C)  TempSrc:  Oral  SpO2:  99%  Weight: 68 kg 69.9 kg  Height: 5\' 2"  (1.575 m) 5\' 1"  (1.549 m)  PainSc:  0-No pain   Body mass index is 29.14 kg/m.  General appearance: Well nourished, well  developed female in no acute distress.  CV: normal s1 and s2, no MRGs Respiratory: Normal respiratory effort, CTAB Neuro/Psych:  Normal mood and affect.    Laboratory: as per HPI UPT neg Cmp pending   Radiology: as per HPI Narrative & Impression  CLINICAL DATA:  Abnormal uterine bleeding   EXAM: TRANSABDOMINAL AND TRANSVAGINAL ULTRASOUND OF PELVIS   TECHNIQUE: Both transabdominal and transvaginal ultrasound examinations of the pelvis were performed. Transabdominal technique was performed for global imaging of the pelvis including uterus, ovaries, adnexal regions, and pelvic cul-de-sac. It was necessary to proceed with endovaginal exam following the transabdominal exam to visualize the endometrium.   COMPARISON:  None Available.   FINDINGS: Uterus   Measurements: 9.5 x 4.5 x 6.8 cm = volume: 151.6 mL. There is a 1.6 x 1.4 x 2.0 cm fibroid posterior left uterine fundus.   Endometrium   Thickness: 5.8 mm.  No focal abnormality visualized.   Right ovary   Measurements: 1.7 x 1.2 x 1.0 cm = volume: 1.1 mL. Normal appearance/no adnexal mass.   Left ovary   Measurements: 1.7 x 0.7 x 1.4 cm = volume: 0.8 mL. Normal appearance/no adnexal mass.   Other findings   No abnormal free fluid.   IMPRESSION: 1. Small uterine fibroid. 2. Endometrium measures 5.8 mm. If bleeding remains unresponsive to hormonal or medical therapy, sonohysterogram should be considered for focal lesion work-up. (Ref: Radiological Reasoning: Algorithmic Workup of Abnormal Vaginal Bleeding with Endovaginal Sonography and Sonohysterography. AJR 2008; 151:V61-60)     Electronically Signed   By: Annia Belt M.D.   On: 07/13/2023 22:34    Assessment: patient stable   Plan: patient amenable to proceeding with hysteroscopy d&c given PMB and amount, tamoxifen usage and ES being thickened at 5.35mm   Ipad interpreter used   RTC post op     Cornelia Copa MD Attending Center for [employer] [facility])

## 2023-09-07 NOTE — Anesthesia Postprocedure Evaluation (Signed)
Anesthesia Post Note  Patient: Kathryn Cannon  Procedure(s) Performed: DILATATION AND CURETTAGE /HYSTEROSCOPY (Uterus)     Patient location during evaluation: PACU Anesthesia Type: General Level of consciousness: awake and alert Pain management: pain level controlled Vital Signs Assessment: post-procedure vital signs reviewed and stable Respiratory status: spontaneous breathing, nonlabored ventilation and respiratory function stable Cardiovascular status: blood pressure returned to baseline Postop Assessment: no apparent nausea or vomiting Anesthetic complications: no   No notable events documented.  Last Vitals:  Vitals:   09/07/23 1035 09/07/23 1040  BP: 119/72   Pulse: 70 66  Resp: 16 16  Temp:  (!) 36.3 C  SpO2: 100% 100%    Last Pain:  Vitals:   09/07/23 0809  TempSrc: Oral  PainSc: 0-No pain                 Shanda Howells

## 2023-09-07 NOTE — Anesthesia Preprocedure Evaluation (Addendum)
Anesthesia Evaluation  Patient identified by MRN, date of birth, ID band Patient awake    Reviewed: Allergy & Precautions, NPO status , Patient's Chart, lab work & pertinent test results  History of Anesthesia Complications Negative for: history of anesthetic complications  Airway Mallampati: II  TM Distance: >3 FB Neck ROM: Full    Dental no notable dental hx.    Pulmonary neg pulmonary ROS   Pulmonary exam normal        Cardiovascular negative cardio ROS Normal cardiovascular exam     Neuro/Psych  Headaches    GI/Hepatic negative GI ROS, Neg liver ROS,,,  Endo/Other  negative endocrine ROS    Renal/GU negative Renal ROS  negative genitourinary   Musculoskeletal negative musculoskeletal ROS (+)    Abdominal   Peds  Hematology negative hematology ROS (+)   Anesthesia Other Findings Hx of breast ca  Reproductive/Obstetrics AUB                             Anesthesia Physical Anesthesia Plan  ASA: 2  Anesthesia Plan: General   Post-op Pain Management: Tylenol PO (pre-op)* and Toradol IV (intra-op)*   Induction: Intravenous  PONV Risk Score and Plan: 3 and Treatment may vary due to age or medical condition, Ondansetron, Dexamethasone and Midazolam  Airway Management Planned: LMA  Additional Equipment: None  Intra-op Plan:   Post-operative Plan: Extubation in OR  Informed Consent: I have reviewed the patients History and Physical, chart, labs and discussed the procedure including the risks, benefits and alternatives for the proposed anesthesia with the patient or authorized representative who has indicated his/her understanding and acceptance.     Dental advisory given and Interpreter used for interview  Plan Discussed with: CRNA  Anesthesia Plan Comments:        Anesthesia Quick Evaluation

## 2023-09-07 NOTE — Anesthesia Procedure Notes (Signed)
Procedure Name: LMA Insertion Date/Time: 09/07/2023 9:55 AM  Performed by: Earmon Phoenix, CRNAPre-anesthesia Checklist: Patient identified, Emergency Drugs available, Suction available, Patient being monitored and Timeout performed Patient Re-evaluated:Patient Re-evaluated prior to induction Oxygen Delivery Method: Circle system utilized Preoxygenation: Pre-oxygenation with 100% oxygen Induction Type: IV induction Ventilation: Mask ventilation without difficulty LMA: LMA inserted LMA Size: 4.0 Number of attempts: 1 Placement Confirmation: positive ETCO2 and breath sounds checked- equal and bilateral Tube secured with: Tape Dental Injury: Teeth and Oropharynx as per pre-operative assessment

## 2023-09-07 NOTE — Discharge Instructions (Addendum)
°  Post Anesthesia Home Care Instructions  Activity: Get plenty of rest for the remainder of the day. A responsible adult should stay with you for 24 hours following the procedure.  For the next 24 hours, DO NOT: -Drive a car -Operate machinery -Drink alcoholic beverages -Take any medication unless instructed by your physician -Make any legal decisions or sign important papers.  Meals: Start with liquid foods such as gelatin or soup. Progress to regular foods as tolerated. Avoid greasy, spicy, heavy foods. If nausea and/or vomiting occur, drink only clear liquids until the nausea and/or vomiting subsides. Call your physician if vomiting continues.  Special Instructions/Symptoms: Your throat may feel dry or sore from the anesthesia or the breathing tube placed in your throat during surgery. If this causes discomfort, gargle with warm salt water. The discomfort should disappear within 24 hours.  If you had a scopolamine patch placed behind your ear for the management of post- operative nausea and/or vomiting:  1. The medication in the patch is effective for 72 hours, after which it should be removed.  Wrap patch in a tissue and discard in the trash. Wash hands thoroughly with soap and water. 2. You may remove the patch earlier than 72 hours if you experience unpleasant side effects which may include dry mouth, dizziness or visual disturbances. 3. Avoid touching the patch. Wash your hands with soap and water after contact with the patch.   Post Anesthesia Home Care Instructions  Activity: Get plenty of rest for the remainder of the day. A responsible adult should stay with you for 24 hours following the procedure.  For the next 24 hours, DO NOT: -Drive a car -Operate machinery -Drink alcoholic beverages -Take any medication unless instructed by your physician -Make any legal decisions or sign important papers.  Meals: Start with liquid foods such as gelatin or soup. Progress to regular  foods as tolerated. Avoid greasy, spicy, heavy foods. If nausea and/or vomiting occur, drink only clear liquids until the nausea and/or vomiting subsides. Call your physician if vomiting continues.  Special Instructions/Symptoms: Your throat may feel dry or sore from the anesthesia or the breathing tube placed in your throat during surgery. If this causes discomfort, gargle with warm salt water. The discomfort should disappear within 24 hours.     

## 2023-09-08 ENCOUNTER — Encounter (HOSPITAL_BASED_OUTPATIENT_CLINIC_OR_DEPARTMENT_OTHER): Payer: Self-pay | Admitting: Obstetrics and Gynecology

## 2023-09-08 LAB — SURGICAL PATHOLOGY

## 2023-10-12 ENCOUNTER — Encounter: Payer: Self-pay | Admitting: Obstetrics and Gynecology

## 2023-10-12 ENCOUNTER — Other Ambulatory Visit: Payer: Self-pay

## 2023-10-12 ENCOUNTER — Ambulatory Visit (INDEPENDENT_AMBULATORY_CARE_PROVIDER_SITE_OTHER): Payer: Self-pay | Admitting: Obstetrics and Gynecology

## 2023-10-12 VITALS — BP 139/89 | HR 86 | Ht 61.0 in | Wt 153.0 lb

## 2023-10-12 DIAGNOSIS — R1010 Upper abdominal pain, unspecified: Secondary | ICD-10-CM

## 2023-10-12 DIAGNOSIS — Z8742 Personal history of other diseases of the female genital tract: Secondary | ICD-10-CM

## 2023-10-12 DIAGNOSIS — N840 Polyp of corpus uteri: Secondary | ICD-10-CM

## 2023-10-12 DIAGNOSIS — Z758 Other problems related to medical facilities and other health care: Secondary | ICD-10-CM

## 2023-10-12 DIAGNOSIS — Z603 Acculturation difficulty: Secondary | ICD-10-CM

## 2023-10-12 DIAGNOSIS — Z09 Encounter for follow-up examination after completed treatment for conditions other than malignant neoplasm: Secondary | ICD-10-CM

## 2023-10-12 DIAGNOSIS — Z9229 Personal history of other drug therapy: Secondary | ICD-10-CM

## 2023-10-12 DIAGNOSIS — Z1211 Encounter for screening for malignant neoplasm of colon: Secondary | ICD-10-CM

## 2023-10-12 DIAGNOSIS — N6092 Unspecified benign mammary dysplasia of left breast: Secondary | ICD-10-CM

## 2023-10-12 MED ORDER — DOXYCYCLINE HYCLATE 100 MG PO CAPS
100.0000 mg | ORAL_CAPSULE | Freq: Two times a day (BID) | ORAL | 0 refills | Status: AC
Start: 2023-10-12 — End: 2023-10-19

## 2023-10-12 NOTE — Progress Notes (Unsigned)
 Obstetrics and Gynecology Visit Return Patient Evaluation  Appointment Date: 10/12/2023  Primary Care Provider: Olegario Messier  OBGYN Clinic: [facility] for [facility]  Chief Complaint: rouine post op check  History of Present Illness:  Kathryn Cannon is a 53 y.o. s/p 1/22 hysteroscopy, d&c for post menopausal bleeding and a h/o tamoxifen usage.   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   She was seen by [facility] 06/09/23 referred for an appointment today due to AUB. She had a pap smear three years ago that was cytology negative and HPV negative so she was told she didn't need another one.   She was last seen by Heme Onc in October 2023 and signed off and only needed SBEs and yearly mammograms   Atrophic appearing findings on hysteroscopy but with moderate amount of curettings. Disordered proliferative phase endometrium and a benign polyp on final pathology.   Interval History: Since that time, she states that she had post op bleeding but that stopped about two weeks ago. She endorses occasional upper abdominal cramping, with no relation to eating.   Review of Systems: as noted in the History of Present Illness.  Patient Active Problem List   Diagnosis Date Noted   Polyp of endometrium 10/12/2023   History of postmenopausal bleeding 08/23/2023   History of tamoxifen therapy 08/23/2023   Language barrier 07/06/2023   Abnormal uterine bleeding (AUB) 07/06/2023   Pre-diabetes 03/27/2020   Hyperlipidemia 03/22/2019   Elevated blood pressure reading 03/22/2019   Screening breast examination 03/01/2019   Atypical ductal hyperplasia of left breast 06/13/2017   Medications:  Kathryn Cannon had no medications administered during this visit. Current Outpatient Medications  Medication Sig Dispense Refill   acetaminophen (TYLENOL) 500 MG tablet Take 1,000 mg by mouth  every 8 (eight) hours as needed for headache.     doxycycline (VIBRAMYCIN) 100 MG capsule Take 1 capsule (100 mg total) by mouth 2 (two) times daily for 7 days. 14 capsule 0   fenofibrate (TRICOR) 48 MG tablet Take 1 tablet (48 mg total) by mouth daily. 90 tablet 3   Heating Pads PADS 1 Units by Does not apply route as needed.     Magnesium Oxide 500 MG CAPS Take 500 mg by mouth daily as needed.     ibuprofen (ADVIL) 600 MG tablet Take 1 tablet (600 mg total) by mouth every 6 (six) hours as needed for mild pain (pain score 1-3), moderate pain (pain score 4-6) or cramping. (Patient not taking: Reported on 10/12/2023)     No current facility-administered medications for this visit.    Allergies: has no known allergies.  Physical Exam:  BP 139/89   Pulse 86   Ht 5\' 1"  (1.549 m)   Wt 153 lb (69.4 kg)   BMI 28.91 kg/m  Body mass index is 28.91 kg/m. General appearance: Well nourished, well developed female in no acute distress.  Abdomen: diffusely non tender to palpation, non distended, and no masses, hernias Neuro/Psych:  Normal mood and affect.     Assessment: patient stable  Plan:  1. Upper abdominal pain (Primary) Patient seen by IM resident clinic last recommend touching base with her PCP and she needs a colonoscopy. Labs (CMP, amylase, lipase), imaging (RUQ u/s) and colonoscopy referral made to GI  2. Postop check Her initial office embx came back as scanty atrophic endometrium but I proceeded with the hysteroscopy, d&c given the amount of  PMB that she described. I told her that usually this occurs (non menopausal uterine lining when a patient is in menopause [06/2024 FSH 72 & estradiol <5]) is usually do to obesity which causes continued proliferation, but her BMI is only 28-29 so most likely this is from the Tamoxifen, and since the inciting event is gone (she finished tamoxifen over a year ago) most likely this wont re-occur, but if the inciting event isn't removed, such as if the  patient doesn't undergo weight loss, progestin therapy is preferred, to decrease endometrial hyperplasia risk. I will touch base with her oncologist to see if progestin therapy is a possibility given her history  If she needs repeat sampling, I would prefer a hysteroscopy, d&c given that her in office embx was so different than her OR pathology.  Will touch base with her patient after I hear from her Oncologist.   3. Polyp of endometrium See above  4. History of postmenopausal bleeding See above  5. History of tamoxifen therapy See above  6. Atypical ductal hyperplasia of left breast See above  7. Language barrier In person interpreter used   Return in about 3 months (around 01/09/2024) for with dr Vergie Living.  Future Appointments  Date Time Provider Department Center  10/13/2023  7:30 AM [facility] [place]    Cornelia Copa MD Attending Center for [facility])

## 2023-10-13 ENCOUNTER — Ambulatory Visit (HOSPITAL_COMMUNITY)
Admission: RE | Admit: 2023-10-13 | Discharge: 2023-10-13 | Disposition: A | Discharge status: Home / Self Care | Payer: No Typology Code available for payment source | Source: Ambulatory Visit | Attending: Obstetrics and Gynecology | Admitting: Obstetrics and Gynecology

## 2023-10-13 DIAGNOSIS — R1010 Upper abdominal pain, unspecified: Secondary | ICD-10-CM | POA: Insufficient documentation

## 2023-10-13 LAB — COMPREHENSIVE METABOLIC PANEL WITH GFR
ALT: 36 [IU]/L — ABNORMAL HIGH (ref 0–32)
AST: 34 [IU]/L (ref 0–40)
Albumin: 4.6 g/dL (ref 3.9–4.9)
Alkaline Phosphatase: 49 [IU]/L (ref 44–121)
BUN/Creatinine Ratio: 19 (ref 9–23)
BUN: 12 mg/dL (ref 6–24)
Bilirubin Total: <0.2 mg/dL (ref 0.0–1.2)
CO2: 22 mmol/L (ref 20–29)
Calcium: 9.5 mg/dL (ref 8.7–10.2)
Chloride: 103 mmol/L (ref 96–106)
Creatinine, Ser: 0.63 mg/dL (ref 0.57–1.00)
Globulin, Total: 2.3 g/dL (ref 1.5–4.5)
Glucose: 100 mg/dL — ABNORMAL HIGH (ref 70–99)
Potassium: 3.9 mmol/L (ref 3.5–5.2)
Sodium: 142 mmol/L (ref 134–144)
Total Protein: 6.9 g/dL (ref 6.0–8.5)
eGFR: 108 mL/min/{1.73_m2}

## 2023-10-13 LAB — AMYLASE: Amylase: 91 U/L (ref 31–110)

## 2023-10-13 LAB — LIPASE: Lipase: 56 U/L (ref 14–72)

## 2023-11-02 LAB — FECAL OCCULT BLOOD, IMMUNOCHEMICAL: Fecal Occult Bld: NEGATIVE

## 2023-11-02 LAB — SPECIMEN STATUS REPORT

## 2023-11-09 ENCOUNTER — Encounter: Payer: Self-pay | Admitting: Physician Assistant

## 2023-12-20 ENCOUNTER — Ambulatory Visit: Payer: Self-pay | Admitting: Student

## 2023-12-20 VITALS — BP 151/81 | HR 62 | Temp 98.1°F | Ht 61.0 in | Wt 153.3 lb

## 2023-12-20 DIAGNOSIS — R030 Elevated blood-pressure reading, without diagnosis of hypertension: Secondary | ICD-10-CM

## 2023-12-20 DIAGNOSIS — D1803 Hemangioma of intra-abdominal structures: Secondary | ICD-10-CM | POA: Insufficient documentation

## 2023-12-20 DIAGNOSIS — R160 Hepatomegaly, not elsewhere classified: Secondary | ICD-10-CM | POA: Insufficient documentation

## 2023-12-20 DIAGNOSIS — M79672 Pain in left foot: Secondary | ICD-10-CM

## 2023-12-20 DIAGNOSIS — R7303 Prediabetes: Secondary | ICD-10-CM

## 2023-12-20 DIAGNOSIS — K769 Liver disease, unspecified: Secondary | ICD-10-CM

## 2023-12-20 DIAGNOSIS — R202 Paresthesia of skin: Secondary | ICD-10-CM

## 2023-12-20 DIAGNOSIS — E781 Pure hyperglyceridemia: Secondary | ICD-10-CM

## 2023-12-20 DIAGNOSIS — R200 Anesthesia of skin: Secondary | ICD-10-CM

## 2023-12-20 DIAGNOSIS — M79673 Pain in unspecified foot: Secondary | ICD-10-CM | POA: Insufficient documentation

## 2023-12-20 DIAGNOSIS — E785 Hyperlipidemia, unspecified: Secondary | ICD-10-CM

## 2023-12-20 DIAGNOSIS — M79671 Pain in right foot: Secondary | ICD-10-CM

## 2023-12-20 LAB — POCT GLYCOSYLATED HEMOGLOBIN (HGB A1C): Hemoglobin A1C: 6 % — AB (ref 4.0–5.6)

## 2023-12-20 LAB — GLUCOSE, CAPILLARY: Glucose-Capillary: 105 mg/dL — ABNORMAL HIGH (ref 70–99)

## 2023-12-20 MED ORDER — FENOFIBRATE 48 MG PO TABS: 48.0000 mg | ORAL_TABLET | Freq: Every day | ORAL | 3 refills | Status: DC

## 2023-12-20 NOTE — Assessment & Plan Note (Signed)
 Weight looks stable. A1c today.

## 2023-12-20 NOTE — Assessment & Plan Note (Signed)
 Sounds like carpal tunnel syndrome.  Hand and wrist are normal on exam.  Brisk biceps reflexes on the right.  Recommend nighttime splinting.

## 2023-12-20 NOTE — Progress Notes (Deleted)
 This is a Psychologist, occupational Note.  The care of the patient was discussed with Dr. Abner Hoffman and the assessment and plan was formulated with their assistance.  Please see their note for official documentation of the patient encounter.   Subjective:   Patient ID: Kathryn Cannon female   DOB: Jul 14, 1971 53 y.o.   MRN: [id number]  HPI: [name] is a 53 y.o.   HLD Fenofibrate  Lipid panel x check today  Ifobt   HTN  Mediation She checks her bp at her house and it always 120/80  Dietary changes/lifestyle  Ros:  Foot pain, keeps her from walking   Has tried to make dietary changes    For the details of today's visit, please refer to the assessment and plan.     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     fenofibrate  (TRICOR ) 48 MG tablet Take 1 tablet (48 mg total) by mouth daily. 90 tablet 3   Heating Pads PADS 1 Units by Does not apply route as needed.     ibuprofen  (ADVIL ) 600 MG tablet Take 1 tablet (600 mg total) by mouth every 6 (six) hours as needed for mild pain (pain score 1-3), moderate pain (pain score 4-6) or cramping. (Patient not taking: Reported on 10/12/2023)     Magnesium Oxide 500 MG CAPS Take 500 mg by mouth daily as needed.     No current facility-administered medications for this visit.   Family History  Problem Relation Age of Onset   Diabetes  Paternal Aunt    Breast cancer Neg Hx    Social History   Socioeconomic History   Marital status: Married    Spouse name: Not on file   Number of children: 3   Years of education: Not on file   Highest education level: 7th grade  Occupational History   Not on file  Tobacco Use   Smoking status: Never   Smokeless tobacco: Never  Vaping Use   Vaping status: Never Used  Substance and Sexual Activity   Alcohol use: No   Drug use: No   Sexual activity: Yes    Birth control/protection: Condom  Other Topics Concern   Not on file  Social History Narrative   Not on file   Social Drivers of Health   Financial Resource Strain: Not on File (08/11/2022)   Received from [facility], [facility]    Financial Resource Strain: 0  Food Insecurity: No Food Insecurity (12/20/2023)   Hunger Vital Sign    Worried About Running Out of Food in the Last Year: Never true    Ran Out of Food in the Last Year: Never true  Transportation Needs: No Transportation Needs (06/09/2023)   PRAPARE - Administrator, Civil Service (Medical): No    Lack of Transportation (Non-Medical):  No  Physical Activity: Not on File (08/11/2022)   Received from [place], [place]   Physical Activity    Physical Activity: 0  Stress: Not on File (08/11/2022)   Received from [facility], [place]   Stress    Stress: 0  Social Connections: Not on File (05/12/2023)   Received from [facility]   Social Connections    Connectedness: 0   Review of Systems: {Review Of Systems:30496} Objective:  Physical Exam: Vitals:   12/20/23 0840  BP: (!) 151/81  Pulse: 62  Temp: 98.1 F (36.7 C)  TempSrc: Oral  SpO2: 100%  Weight: 153 lb 4.8 oz (69.5 kg)  Height: 5\' 1"  (1.549 m)    Constitutional: NAD, appears comfortable HEENT: Atraumatic, normocephalic. PERRL, anicteric sclera.  Neck: Supple, trachea midline.  Cardiovascular: RRR, no murmurs, rubs, or gallops.  Pulmonary/Chest: CTAB, no wheezes, rales, or  rhonchi. No chest wall abnormalities.  Abdominal: Soft, non tender, non distended. +BS.  GU: ***  Extremities: Warm and well perfused. Distal pulses intact. No edema.  Neurological: A&Ox3, CN II - XII grossly intact.  Skin: No rashes or erythema  Psychiatric: Normal mood and affect  Assessment & Plan:   No problem-specific Assessment & Plan notes found for this encounter.

## 2023-12-20 NOTE — Assessment & Plan Note (Signed)
 Hypertriglyceridemia. On fenofibrate . Doing well. Check lipids today. Refilled fenofibrate  48 mg daily.

## 2023-12-20 NOTE — Progress Notes (Signed)
 Patient name: Kathryn Cannon Date of birth: 01/09/1971 Date of visit: 12/20/23  Subjective  Chief Complaint  Patient presents with   Follow-up    Routine office visit / ? labs    HPI Kathryn Cannon is here for follow-up.  Her primary concern is pain in both feet.  It is in the mid feet.  It is worse with walking.  Sometimes she has a bruise over the midfoot.  Her husband got her some supportive shoes and her pain has improved somewhat.  She has numbness, stiffness, and discomfort in her right hand.  It is worse at nighttime.  She shakes her hand out for relief.  She is having difficulty with weight loss.  ROS Per HPI.  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/2017   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   fenofibrate  (TRICOR ) 48 MG tablet Take 1 tablet (48 mg  total) by mouth daily.   Heating Pads PADS 1 Units by Does not apply route as needed.   Magnesium Oxide 500 MG CAPS Take 500 mg by mouth daily as needed.   [DISCONTINUED] fenofibrate  (TRICOR ) 48 MG tablet Take 1 tablet (48 mg total) by mouth daily.   [DISCONTINUED] ibuprofen  (ADVIL ) 600 MG tablet Take 1 tablet (600 mg total) by mouth every 6 (six) hours as needed for mild pain (pain score 1-3), moderate pain (pain score 4-6) or cramping. (Patient not taking: Reported on 10/12/2023)   No facility-administered encounter medications on file as of 12/20/2023.   Past Surgical History:  Procedure Laterality Date   BREAST LUMPECTOMY WITH RADIOACTIVE SEED LOCALIZATION Left 04/27/2017   Procedure: RADIOCATIVE SEED GUIDED LEFT BREAST LUMPECTOMY, ERAS PATHWAY;  Surgeon: Caralyn Chandler, MD;  Location[facility] OR;  Service: General;  Laterality: Left;   CESAREAN SECTION     3 previous   HYSTEROSCOPY WITH D & C N/A 09/07/2023   Procedure: DILATATION AND CURETTAGE /HYSTEROSCOPY;  Surgeon: Raynell Caller, MD;  Location: [facility];  Service: Gynecology;  Laterality: N/A;   Family History  Problem Relation Age of Onset   Diabetes Paternal Aunt    Breast cancer Neg Hx    Social History   Socioeconomic History   Marital status: Married    Spouse name: Not on file   Number of children: 3   Years of  education: Not on file   Highest education level: 7th grade  Occupational History   Not on file  Tobacco Use   Smoking status: Never   Smokeless tobacco: Never  Vaping Use   Vaping status: Never Used  Substance and Sexual Activity   Alcohol use: No   Drug use: No   Sexual activity: Yes    Birth control/protection: Condom  Other Topics Concern   Not on file  Social History Narrative   Not on file   Social Drivers of Health   Financial Resource Strain: Not on File (08/11/2022)   Received from [facility], [facility]    Financial Resource Strain: 0  Food Insecurity:  No Food Insecurity (12/20/2023)   Hunger Vital Sign    Worried About Running Out of Food in the Last Year: Never true    Ran Out of Food in the Last Year: Never true  Transportation Needs: No Transportation Needs (06/09/2023)   PRAPARE - Administrator, Civil Service (Medical): No    Lack of Transportation (Non-Medical): No  Physical Activity: Not on File (08/11/2022)   Received from [place], [place]   Physical Activity    Physical Activity: 0  Stress: Not on File (08/11/2022)   Received from [facility], [place]   Stress    Stress: 0  Social Connections: Not on File (05/12/2023)   Received from [facility]   Social Connections    Connectedness: 0  Intimate Partner Violence: Not At Risk (12/20/2023)   Humiliation, Afraid, Rape, and Kick questionnaire    Fear of Current or Ex-Partner: No    Emotionally Abused: No    Physically Abused: No    Sexually Abused: No     Objective  Today's Vitals   12/20/23 0840  BP: (!) 151/81  Pulse: 62  Temp: 98.1 F (36.7 C)  TempSrc: Oral  SpO2: 100%  Weight: 153 lb 4.8 oz (69.5 kg)  Height: 5\' 1"  (1.549 m)  Body mass index is 28.97 kg/m.   Physical Exam Well-appearing Oral mucosas moist and pink No cervical lymphadenopathy No thyromegaly or palpable nodules Heart rate and rhythms normal, radial pulses are strong, no murmurs, no lower extremity edema Breathing comfortably, lungs are clear Abdomen soft and nontender, no hepatomegaly or splenomegaly Skin is warm and dry No ankle tenderness, range of motion is normal in both ankles, no tenderness over base of fifth metatarsals bilaterally Alert and oriented, speech is normal sounding, no facial asymmetry, 2+ biceps reflexes bilaterally, extraocular movements normal, pupils equal and reactive   Assessment & Plan  Problem List Items Addressed This Visit     Hyperlipidemia   Hypertriglyceridemia. On fenofibrate . Doing well. Check lipids today. Refilled fenofibrate  48 mg daily.      Relevant  Medications   fenofibrate  (TRICOR ) 48 MG tablet   Other Relevant Orders   Lipid Profile   Elevated blood pressure reading   Hypertensive in clinic. Was rushing to appointment this morning, doctors make her nervous. She reports measuring at home with systolic numbers between 120 and 130. Will have her keep log, defer starting medication today.      Pre-diabetes - Primary   Weight looks stable. A1c today.      Relevant Orders   POC Hbg A1C (Completed)   Liver mass, left lobe   Incidental finding on right upper quadrant ultrasound, ordered for upper abdominal pain.  Interpreting radiologist recommended MRI pre and postcontrast, which I ordered today.  Query hepatic adenoma or  focal nodular hyperplasia.  Less likely metastatic disease, she was on tamoxifen  for 5 years after lumpectomy for atypical ductal hyperplasia.      Pain of midfoot   Bilateral midfoot pain, right worse than left ongoing for months.  Associated with walking.  Better after she got some more supportive shoes.  She did have some bruising over the right midfoot that has resolved.  No trauma.  No tenderness over the fifth metatarsals on exam.  Since this is improving we will defer x-rays but wonder about stress fractures there.  Recommend supportive shoes for walking.      Numbness and tingling in right hand   Sounds like carpal tunnel syndrome.  Hand and wrist are normal on exam.  Brisk biceps reflexes on the right.  Recommend nighttime splinting.      Other Visit Diagnoses       Hypertriglyceridemia       Relevant Medications   fenofibrate  (TRICOR ) 48 MG tablet     Liver disease       Relevant Orders   MR Abdomen W Wo Contrast      Return in about 6 months (around 06/21/2024) for metabolic syndrome.  Adria Hopkins MD 12/20/2023, 10:15 AM

## 2023-12-20 NOTE — Patient Instructions (Addendum)
 Recomiendo una frula nocturna para el sndrome del tnel carpiano para Paramedic las [employer] en la mano derecha. Puede comprarla sin receta mdica.  Asegrese de usar calzado con buen soporte al caminar. Si el dolor de Hotel manager, llame a la clnica y programaremos una radiografa.  Lleve un registro de su presin arterial en casa. Llmenos en 2 semanas para [employer].  Contine con su fenofibrato para el colesterol. Lo resurtir hoy mismo.  Recomiendo eliminar por completo las bebidas azucaradas como refrescos, t [place] y [place]. Recomiendo ms verduras, protenas magras y legumbres. Las verduras congeladas son saludables y Building control surveyor. Los frijoles son Ebb Goldman fuente saludable y [place] y [place]. Recomiendo aumentar gradualmente el ejercicio. Una caminata diaria es una excelente manera de comenzar un programa de ejercicios.  Recuerde traer [employer] que toma (incluidos los de venta libre y los suplementos) a cada visita a Glass blower/designer.  Este resumen posterior a la visita es una revisin importante de las pruebas, las derivaciones y los cambios de medicacin que se comentaron durante su visita. Si tiene preguntas o inquietudes, llame al [phone]. Fuera del horario de atencin de Glass blower/designer, llame al hospital principal al [phone] y pregunte a la operadora por el residente de guardia de medicina interna.  --------------------------------------------------  I recommend a nighttime splint for carpal tunnel syndrome to help with your right hand discomfort. You can purchase these without a prescription.  Make sure you wear supportive shoes while walking. If your foot pain gets worse, call the clinic and we'll arrange for x-rays.  Please keep a log of your blood pressure at home. Call us  in 2 weeks to share your log results.  Continue your fenofibrate  for your cholesterol. I will refill this today.  I recommend eliminating sugary beverages like  soda, sweet tea, and juice entirely. I recommended more vegetables, lean protein, and legumes. Frozen vegetables are healthy and inexpensive. Beans are a healthy and inexpensive source of lean protein and fiber. I recommend gradually increasing exercise. A daily walk is a great way to start an exercise program.  Remember to bring all of the medications that you take (including over the counter medications and supplements) with you to every clinic visit.  This after visit summary is an important review of tests, referrals, and medication changes that were discussed during your visit. If you have questions or concerns, call [phone]. Outside of clinic business hours, call the main hospital at [phone] and ask the operator for the on-call internal medicine resident.   Adria Hopkins MD 12/20/2023, 9:54 AM

## 2023-12-20 NOTE — Assessment & Plan Note (Signed)
 Incidental finding on right upper quadrant ultrasound, ordered for upper abdominal pain.  Interpreting radiologist recommended MRI pre and postcontrast, which I ordered today.  Query hepatic adenoma or focal nodular hyperplasia.  Less likely metastatic disease, she was on tamoxifen  for 5 years after lumpectomy for atypical ductal hyperplasia.

## 2023-12-20 NOTE — Assessment & Plan Note (Signed)
 Hypertensive in clinic. Was rushing to appointment this morning, doctors make her nervous. She reports measuring at home with systolic numbers between 120 and 130. Will have her keep log, defer starting medication today.

## 2023-12-20 NOTE — Progress Notes (Signed)
 Internal Medicine Clinic Attending  Case discussed with the resident at the time of the visit.  We reviewed the resident's history and exam and pertinent patient test results.  I agree with the assessment, diagnosis, and plan of care documented in the resident's note.

## 2023-12-20 NOTE — Assessment & Plan Note (Signed)
 Bilateral midfoot pain, right worse than left ongoing for months.  Associated with walking.  Better after she got some more supportive shoes.  She did have some bruising over the right midfoot that has resolved.  No trauma.  No tenderness over the fifth metatarsals on exam.  Since this is improving we will defer x-rays but wonder about stress fractures there.  Recommend supportive shoes for walking.

## 2023-12-22 LAB — LIPID PANEL
Chol/HDL Ratio: 4.7 ratio — ABNORMAL HIGH (ref 0.0–4.4)
Cholesterol, Total: 250 mg/dL — ABNORMAL HIGH (ref 100–199)
HDL: 53 mg/dL (ref >39)
LDL Chol Calc (NIH): 161 mg/dL — ABNORMAL HIGH (ref 0–99)
Triglycerides: 197 mg/dL — ABNORMAL HIGH (ref 0–149)
VLDL Cholesterol Cal: 36 mg/dL (ref 5–40)

## 2023-12-28 ENCOUNTER — Ambulatory Visit (HOSPITAL_COMMUNITY)
Admission: RE | Admit: 2023-12-28 | Discharge: 2023-12-28 | Disposition: A | Discharge status: Home / Self Care | Payer: Self-pay | Source: Ambulatory Visit | Attending: Internal Medicine | Admitting: Internal Medicine

## 2023-12-28 ENCOUNTER — Ambulatory Visit: Payer: Self-pay | Admitting: Student

## 2023-12-28 DIAGNOSIS — K769 Liver disease, unspecified: Secondary | ICD-10-CM | POA: Insufficient documentation

## 2023-12-28 MED ORDER — GADOBUTROL 1 MMOL/ML IV SOLN
7.0000 mL | Freq: Once | INTRAVENOUS | Status: AC | PRN
  Administered 2023-12-28: 7 mL via INTRAVENOUS

## 2024-01-02 NOTE — Progress Notes (Signed)
 Brigitte Canard, PA-C [address] [place], [place]  [address] Phone: [phone]   Gastroenterology Consultation  Referring Provider:     Nooruddin, Saad, MD Primary Care Physician:  Nooruddin, Saad, MD Primary Gastroenterologist:  Brigitte Canard, PA-C / Legrand Puma, MD  Reason for Consultation:     Abdominal pain, discuss colonoscopy        HPI:   Kathryn Cannon is a 53 y.o. y/o female referred for consultation & management  by Nooruddin, Saad, MD. We are using Spanish interpreter today from [employer] [facility] language services.  Current symptoms: Patient has had some epigastric pain.  She denies any lower abdominal pain or RLQ pain.  Denies heartburn, dysphagia, nausea, or vomiting.  Has had some constipation, abdominal bloating and swelling.  Has bowel movement every 3 days.  Takes magnesium once daily with mild benefit.  Denies rectal bleeding or unintentional weight loss.  She has had hot flashes with menopause symptoms.  Currently being followed by GYN for dysfunctional uterine bleeding and had GYN surgery in the past year.  No previous colonoscopy or GI evaluation.  She had 2 negative FIT tests 2 months ago and 1 year ago through her GYN.  She denies family history of colon cancer.  09/2023 RUQ abdominal ultrasound: Hepatic steatosis.  2 cm hypoechoic mass in the left hepatic lobe.  Normal gallbladder.  No gallstones.  CBD 4.2 mm.  12/2023 abdominal MRI with and without contrast: Moderate diffuse hepatic steatosis.  1.2 x 1.8 cm benign hemangioma in the left hepatic lobe.  No other liver lesions.  Normal gallbladder, pancreas, spleen, kidneys, stomach, and intestines.  2 uterine fibroids.  She denies alcohol use.  No family history of liver disease.  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   oncology--- dr Gardner Jun  lindsey causey np;   dx 08/ 2018  s/p left breast lumpectomy ,  left  breast atypical ductal hyperplasia,  started tamoxifen  10/ 2018 and completed 5 yrs 10/ 2023 releasese by oncology   History of tamoxifen  therapy    started 10/ 2018  completed 10/ 2023   Hyperlipidemia    Pre-diabetes     Past Surgical History:  Procedure Laterality Date   BREAST LUMPECTOMY WITH RADIOACTIVE SEED LOCALIZATION Left 04/27/2017   Procedure: RADIOCATIVE SEED GUIDED LEFT BREAST LUMPECTOMY, ERAS PATHWAY;  Surgeon: Caralyn Chandler, MD;  Location: [facility] OR;  Service: General;  Laterality: Left;   CESAREAN SECTION     3 previous   HYSTEROSCOPY WITH D & C N/A 09/07/2023   Procedure: DILATATION AND CURETTAGE /HYSTEROSCOPY;  Surgeon: Raynell Caller, MD;  Location: [facility];  Service: Gynecology;  Laterality: N/A;    Prior to Admission medications   Medication Sig Start Date End Date Taking? Authorizing Provider  acetaminophen  (TYLENOL ) 500 MG tablet Take 1,000 mg by mouth every 8 (eight) hours as needed for headache.    [provider]  fenofibrate  (TRICOR ) 48 MG tablet Take 1 tablet (48 mg total) by mouth daily. 12/20/23   Adria Hopkins, MD  Heating Pads PADS 1 Units by Does not apply route as needed. 09/07/23   Raynell Caller, MD  Magnesium Oxide 500 MG CAPS Take 500 mg by mouth daily as needed.    [provider]    Family History  Problem Relation Age of Onset   Diabetes Paternal Aunt  Breast cancer Neg Hx      Social History   Tobacco Use   Smoking status: Never   Smokeless tobacco: Never  Vaping Use   Vaping status: Never Used  Substance Use Topics   Alcohol use: No   Drug use: No    Allergies as of 01/03/2024   (No Known Allergies)    Review of Systems:    All systems reviewed and negative except where noted in HPI.   Physical Exam:  BP 130/80   Pulse 72   Ht 5\' 1"  (1.549 m)   Wt 152 lb (68.9 kg)   SpO2 99%   BMI 28.72 kg/m  No LMP recorded. (Menstrual status: Irregular Periods).  General:   Alert,   Well-developed, well-nourished, pleasant and cooperative in NAD Lungs:  Respirations even and unlabored.  Clear throughout to auscultation.   No wheezes, crackles, or rhonchi. No acute distress. Heart:  Regular rate and rhythm; no murmurs, clicks, rubs, or gallops. Abdomen:  Normal bowel sounds.  No bruits.  Soft, and non-distended without masses, hepatosplenomegaly or hernias noted.  No Tenderness.  No guarding or rebound tenderness.    Neurologic:  Alert and oriented x3;  grossly normal neurologically. Psych:  Alert and cooperative. Normal mood and affect.  Imaging Studies: MR Abdomen W Wo Contrast Result Date: 12/28/2023 CLINICAL DATA:  Liver lesion, > 1cm. EXAM: MRI ABDOMEN WITHOUT AND WITH CONTRAST TECHNIQUE: Multiplanar multisequence MR imaging of the abdomen was performed both before and after the administration of intravenous contrast. CONTRAST:  7mL GADAVIST  GADOBUTROL  1 MMOL/ML IV SOLN COMPARISON:  Ultrasound abdomen from 10/13/2023. FINDINGS: Lower chest: Unremarkable MR appearance to the lung bases. No pleural effusion. No pericardial effusion. Normal heart size. Hepatobiliary: The liver is normal in size. Noncirrhotic configuration. There is marked diffuse hepatic steatosis. There is an approximately 1.2 x 1.8 cm subcapsular mildly T2 hyperintense lesion in the left hepatic lobe, segment 2/3 junction region, posteriorly, which is compatible with hemangioma. No other focal liver lesion seen. The portal vein, intrahepatic veins and their major tributaries are patent. No intrahepatic or extrahepatic bile duct dilatation. No choledocholithiasis. Unremarkable gallbladder. Pancreas: No mass, inflammatory changes or other parenchymal abnormality identified. No main pancreatic duct dilation. 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 Spleen:  Within normal limits in size and appearance. No focal mass. Adrenals/Urinary Tract: Unremarkable adrenal glands. No hydroureteronephrosis. No suspicious renal mass.  Stomach/Bowel: Visualized portions within the abdomen are unremarkable. No disproportionate dilation of bowel loops. Unremarkable appendix. Vascular/Lymphatic: No pathologically enlarged lymph nodes identified. No abdominal aortic aneurysm demonstrated. No ascites. Other: Evaluation of reproductive organs is limited on the provided T2 coronal images. However, note is made of an approximately 1.6 x 1.6 cm leiomyoma in the left side of the fundus of the uterus. There is a smaller probable leiomyoma in the left side of the upper uterine body as well. A nonaggressive 1.2 x 1.4 cm follicle noted in the left ovary. Musculoskeletal: No suspicious bone lesions identified. IMPRESSION: 1. There is a 1.2 x 1.8 cm hemangioma in the left hepatic lobe, segment 2/3 junction region, posteriorly. No other focal liver lesion seen. There is marked diffuse hepatic steatosis. 2. There are at least 2 uterine leiomyomas. 3. Multiple other nonacute observations, as described above. Electronically Signed   By: Beula Brunswick M.D.   On: 12/28/2023 09:22    Labs: CBC    Component Value Date/Time   WBC 5.7 07/06/2023 1413   WBC 6.0 05/27/2021 1027   WBC 6.4  04/25/2017 1415   RBC 4.30 07/06/2023 1413   RBC 4.45 05/27/2021 1027   HGB 13.2 07/06/2023 1413   HCT 39.8 07/06/2023 1413   PLT 371 07/06/2023 1413   MCV 93 07/06/2023 1413   MCH 30.7 07/06/2023 1413   MCH 30.6 05/27/2021 1027   MCHC 33.2 07/06/2023 1413   MCHC 34.1 05/27/2021 1027   RDW 11.9 07/06/2023 1413   LYMPHSABS 2.2 05/27/2021 1027   MONOABS 0.6 05/27/2021 1027   EOSABS 0.1 05/27/2021 1027   BASOSABS 0.0 05/27/2021 1027    CMP     Component Value Date/Time   NA 142 10/12/2023 1446   K 3.9 10/12/2023 1446   CL 103 10/12/2023 1446   CO2 22 10/12/2023 1446   GLUCOSE 100 (H) 10/12/2023 1446   GLUCOSE 110 (H) 09/07/2023 0819   BUN 12 10/12/2023 1446   CREATININE 0.63 10/12/2023 1446   CREATININE 0.67 05/27/2021 1027   CALCIUM 9.5 10/12/2023 1446    PROT 6.9 10/12/2023 1446   ALBUMIN 4.6 10/12/2023 1446   AST 34 10/12/2023 1446   AST 27 05/27/2021 1027   ALT 36 (H) 10/12/2023 1446   ALT 23 05/27/2021 1027   ALKPHOS 49 10/12/2023 1446   BILITOT <0.2 10/12/2023 1446   BILITOT 0.4 05/27/2021 1027   GFRNONAA >60 09/07/2023 0819   GFRNONAA >60 05/27/2021 1027    Assessment and Plan:   Natina Cannon is a 53 y.o. y/o female has been referred for   1.  Upper abdominal pain - Check H. Pylori stool test. - Start OTC Pepcid 20mg  BID - Avoid NSAIDs,  - Recommend Lifestyle Modifications to prevent Acid Reflux.  Rec. Avoid coffee, sodas, peppermint, garlic, onions, alcohol, citrus fruits, chocolate, tomatoes, fatty and spicey foods.  Avoid eating 2-3 hours before bedtime.   - EGD if symptoms worsen or persist.  2.  Hepatic steatosis - Recommend a low-fat diet, regular exercise, and weight loss. Patient education handout about fatty liver disease was given and discussed from up-to-date.  LFTs have been looked fairly normal.  She denies alcohol use.   Fibrosis 4 Score = .78 (Low risk)        Interpretation for patients with NAFLD          <1.30       -  F0-F1 (Low risk)          1.30-2.67 -  Indeterminate           >2.67      -  F3-F4 (High risk)     Validated for ages 19-65  3.  Benign liver hemangioma - Reassurance.  Patient education discussed.  4.  Constipation - Start MiraLAX 1 capful 17 g in a drink once daily. - High-fiber diet.  30 g of dietary fiber daily. - Drink 64 ounces of fluids daily.  5.  Colon cancer screening - Scheduling Colonoscopy I discussed risks of colonoscopy with patient to include risk of bleeding, colon perforation, and risk of sedation.  Patient expressed understanding and agrees to proceed with colonoscopy.   Follow up in 3 months.  Brigitte Canard, PA-C

## 2024-01-03 ENCOUNTER — Encounter: Payer: Self-pay | Admitting: Physician Assistant

## 2024-01-03 ENCOUNTER — Ambulatory Visit (INDEPENDENT_AMBULATORY_CARE_PROVIDER_SITE_OTHER): Payer: Self-pay | Admitting: Physician Assistant

## 2024-01-03 VITALS — BP 130/80 | HR 72 | Ht 61.0 in | Wt 152.0 lb

## 2024-01-03 DIAGNOSIS — D1803 Hemangioma of intra-abdominal structures: Secondary | ICD-10-CM

## 2024-01-03 DIAGNOSIS — R1013 Epigastric pain: Secondary | ICD-10-CM

## 2024-01-03 DIAGNOSIS — R1010 Upper abdominal pain, unspecified: Secondary | ICD-10-CM

## 2024-01-03 DIAGNOSIS — K5900 Constipation, unspecified: Secondary | ICD-10-CM

## 2024-01-03 DIAGNOSIS — K760 Fatty (change of) liver, not elsewhere classified: Secondary | ICD-10-CM

## 2024-01-03 DIAGNOSIS — K5904 Chronic idiopathic constipation: Secondary | ICD-10-CM

## 2024-01-03 DIAGNOSIS — Z1211 Encounter for screening for malignant neoplasm of colon: Secondary | ICD-10-CM

## 2024-01-03 MED ORDER — NA SULFATE-K SULFATE-MG SULF 17.5-3.13-1.6 GM/177ML PO SOLN: 1.0000 | Freq: Once | ORAL | 0 refills | Status: AC

## 2024-01-03 NOTE — Progress Notes (Signed)
 Noted

## 2024-01-03 NOTE — Patient Instructions (Addendum)
 Your provider has requested that you go to the basement level for lab work before leaving today. Press "B" on the elevator. The lab is located at the first door on the left as you exit the elevator.     For constipation: Start OTC Miralax Powder Mix 1 capful in 6 to 8 ounces of a drink once daily  Recommend high-fiber diet, 30 g of fiber daily Eat fruits, vegetables, and whole grains Drink 64 ounces of water / fluids daily.  --------------------------------------------------------------------------------------------------------  For Upper Abdominal Pain: Start Over the Counter Pepcid (Famotidine) 20mg  1 tablet twice daily.  --------------------------------------------------------------------------------------------------------  You have been scheduled for a colonoscopy. Please follow written instructions given to you at your visit today.   If you use inhalers (even only as needed), please bring them with you on the day of your procedure.  DO NOT TAKE 7 DAYS PRIOR TO TEST- Trulicity (dulaglutide) Ozempic, Wegovy (semaglutide) Mounjaro (tirzepatide) Bydureon Bcise (exanatide extended release)  DO NOT TAKE 1 DAY PRIOR TO YOUR TEST Rybelsus (semaglutide) Adlyxin (lixisenatide) Victoza (liraglutide) Byetta (exanatide) ___________________________________________________________________________  Please follow up sooner if symptoms increase or worsen   Due to recent changes in healthcare laws, you may see the results of your imaging and laboratory studies on MyChart before your provider has had a chance to review them.  We understand that in some cases there may be results that are confusing or concerning to you. Not all laboratory results come back in the same time frame and the provider may be waiting for multiple results in order to interpret others.  Please give us  48 hours in order for your provider to thoroughly review all the results before contacting the office for clarification  of your results.   _______________________________________________________  If your blood pressure at your visit was 140/90 or greater, please contact your primary care physician to follow up on this.  _______________________________________________________  If you are age 19 or older, your body mass index should be between 23-30. Your Body mass index is 28.72 kg/m. If this is out of the aforementioned range listed, please consider follow up with your Primary Care Provider.  If you are age 42 or younger, your body mass index should be between 19-25. Your Body mass index is 28.72 kg/m. If this is out of the aformentioned range listed, please consider follow up with your Primary Care Provider.   ________________________________________________________  The [facility] providers would like to encourage you to use MYCHART to communicate with providers for non-urgent requests or questions.  Due to long hold times on the telephone, sending your provider a message by [facility] may be a faster and more efficient way to get a response.  Please allow 48 business hours for a response.  Please remember that this is for non-urgent requests.  _______________________________________________________ Thank you for trusting me with your gastrointestinal care!   Brigitte Canard, PA-C

## 2024-01-10 ENCOUNTER — Encounter: Payer: Self-pay | Admitting: Internal Medicine

## 2024-01-10 ENCOUNTER — Ambulatory Visit (AMBULATORY_SURGERY_CENTER): Payer: Self-pay | Admitting: Internal Medicine

## 2024-01-10 VITALS — BP 121/75 | HR 60 | Temp 98.6°F | Resp 13 | Ht 61.0 in | Wt 152.0 lb

## 2024-01-10 DIAGNOSIS — Z1211 Encounter for screening for malignant neoplasm of colon: Secondary | ICD-10-CM

## 2024-01-10 MED ORDER — SODIUM CHLORIDE 0.9 % IV SOLN: 500.0000 mL | INTRAVENOUS | Status: DC

## 2024-01-10 NOTE — Progress Notes (Signed)
 Expand All Collapse All       Brigitte Canard, PA-C [address] [place], [place]  [address] Phone: [phone]   Gastroenterology Consultation   Referring Provider:     Nooruddin, Saad, MD Primary Care Physician:  Nooruddin, Saad, MD Primary Gastroenterologist:  Brigitte Canard, PA-C / Legrand Puma, MD  Reason for Consultation:     Abdominal pain, discuss colonoscopy        HPI:   Kathryn Cannon is a 53 y.o. y/o female referred for consultation & management  by Nooruddin, Saad, MD. We are using Spanish interpreter today from [facility] language services.   Current symptoms: Patient has had some epigastric pain.  She denies any lower abdominal pain or RLQ pain.  Denies heartburn, dysphagia, nausea, or vomiting.  Has had some constipation, abdominal bloating and swelling.  Has bowel movement every 3 days.  Takes magnesium once daily with mild benefit.  Denies rectal bleeding or unintentional weight loss.  She has had hot flashes with menopause symptoms.  Currently being followed by GYN for dysfunctional uterine bleeding and had GYN surgery in the past year.   No previous colonoscopy or GI evaluation.  She had 2 negative FIT tests 2 months ago and 1 year ago through her GYN.  She denies family history of colon cancer.   09/2023 RUQ abdominal ultrasound: Hepatic steatosis.  2 cm hypoechoic mass in the left hepatic lobe.  Normal gallbladder.  No gallstones.  CBD 4.2 mm.   12/2023 abdominal MRI with and without contrast: Moderate diffuse hepatic steatosis.  1.2 x 1.8 cm benign hemangioma in the left hepatic lobe.  No other liver lesions.  Normal gallbladder, pancreas, spleen, kidneys, stomach, and intestines.  2 uterine fibroids.  She denies alcohol use.  No family history of liver disease.       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    oncology--- dr Gardner Jun  lindsey causey np;   dx 08/  2018  s/p left breast lumpectomy ,  left breast atypical ductal hyperplasia,  started tamoxifen  10/ 2018 and completed 5 yrs 10/ 2023 releasese by oncology   History of tamoxifen  therapy      started 10/ 2018  completed 10/ 2023   Hyperlipidemia     Pre-diabetes                 Past Surgical History:  Procedure Laterality Date   BREAST LUMPECTOMY WITH RADIOACTIVE SEED LOCALIZATION Left 04/27/2017    Procedure: RADIOCATIVE SEED GUIDED LEFT BREAST LUMPECTOMY, ERAS PATHWAY;  Surgeon: Caralyn Chandler, MD;  Location: [facility] OR;  Service: General;  Laterality: Left;   CESAREAN SECTION        3 previous   HYSTEROSCOPY WITH D & C N/A 09/07/2023    Procedure: DILATATION AND CURETTAGE /HYSTEROSCOPY;  Surgeon: Raynell Caller, MD;  Location: [facility];  Service: Gynecology;  Laterality: N/A;                 Prior to Admission medications   Medication Sig Start Date End Date Taking? Authorizing Provider  acetaminophen  (TYLENOL ) 500 MG tablet Take 1,000 mg by mouth every 8 (eight) hours as needed for headache.       [provider]  fenofibrate  (TRICOR ) 48 MG tablet Take 1 tablet (48 mg total) by mouth daily. 12/20/23     Adria Hopkins, MD  Heating Pads PADS 1 Units by Does not apply route as needed. 09/07/23     Raynell Caller, MD  Magnesium Oxide 500 MG CAPS Take 500 mg by mouth daily as needed.       [provider]           Family History  Problem Relation Age of Onset   Diabetes Paternal Aunt     Breast cancer Neg Hx            Social History  Social History        Tobacco Use   Smoking status: Never   Smokeless tobacco: Never  Vaping Use   Vaping status: Never Used  Substance Use Topics   Alcohol use: No   Drug use: No           Allergies as of 01/03/2024   (No Known Allergies)      Review of Systems:    All systems reviewed and negative except where noted in HPI.    Physical Exam:  BP 130/80   Pulse 72   Ht 5\' 1"  (1.549 m)    Wt 152 lb (68.9 kg)   SpO2 99%   BMI 28.72 kg/m  No LMP recorded. (Menstrual status: Irregular Periods).   General:   Alert,  Well-developed, well-nourished, pleasant and cooperative in NAD Lungs:  Respirations even and unlabored.  Clear throughout to auscultation.   No wheezes, crackles, or rhonchi. No acute distress. Heart:  Regular rate and rhythm; no murmurs, clicks, rubs, or gallops. Abdomen:  Normal bowel sounds.  No bruits.  Soft, and non-distended without masses, hepatosplenomegaly or hernias noted.  No Tenderness.  No guarding or rebound tenderness.    Neurologic:  Alert and oriented x3;  grossly normal neurologically. Psych:  Alert and cooperative. Normal mood and affect.   Imaging Studies:  Imaging Results  MR Abdomen W Wo Contrast Result Date: 12/28/2023 CLINICAL DATA:  Liver lesion, > 1cm. EXAM: MRI ABDOMEN WITHOUT AND WITH CONTRAST TECHNIQUE: Multiplanar multisequence MR imaging of the abdomen was performed both before and after the administration of intravenous contrast. CONTRAST:  7mL GADAVIST  GADOBUTROL  1 MMOL/ML IV SOLN COMPARISON:  Ultrasound abdomen from 10/13/2023. FINDINGS: Lower chest: Unremarkable MR appearance to the lung bases. No pleural effusion. No pericardial effusion. Normal heart size. Hepatobiliary: The liver is normal in size. Noncirrhotic configuration. There is marked diffuse hepatic steatosis. There is an approximately 1.2 x 1.8 cm subcapsular mildly T2 hyperintense lesion in the left hepatic lobe, segment 2/3 junction region, posteriorly, which is compatible with hemangioma. No other focal liver lesion seen. The portal vein, intrahepatic veins and their major tributaries are patent. No intrahepatic or extrahepatic bile duct dilatation. No choledocholithiasis. Unremarkable gallbladder. Pancreas: No mass, inflammatory changes or other parenchymal abnormality identified. No main pancreatic duct dilation. 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 Spleen:  Within normal  limits in size and appearance. No focal mass. Adrenals/Urinary Tract: Unremarkable adrenal glands. No hydroureteronephrosis. No suspicious renal mass. Stomach/Bowel: Visualized portions within the abdomen are unremarkable. No disproportionate dilation of bowel loops. Unremarkable appendix. Vascular/Lymphatic: No pathologically enlarged lymph nodes identified. No abdominal aortic aneurysm demonstrated. No ascites. Other: Evaluation of reproductive organs is limited on the provided T2 coronal images. However, note is made of an approximately 1.6 x 1.6 cm leiomyoma in the left side of the fundus of the uterus. There is a smaller probable leiomyoma in the left side of the upper uterine body as well. A nonaggressive 1.2 x 1.4 cm follicle  noted in the left ovary. Musculoskeletal: No suspicious bone lesions identified. IMPRESSION: 1. There is a 1.2 x 1.8 cm hemangioma in the left hepatic lobe, segment 2/3 junction region, posteriorly. No other focal liver lesion seen. There is marked diffuse hepatic steatosis. 2. There are at least 2 uterine leiomyomas. 3. Multiple other nonacute observations, as described above. Electronically Signed   By: Beula Brunswick M.D.   On: 12/28/2023 09:22       Labs: CBC Labs (Brief)          Component Value Date/Time    WBC 5.7 07/06/2023 1413    WBC 6.0 05/27/2021 1027    WBC 6.4 04/25/2017 1415    RBC 4.30 07/06/2023 1413    RBC 4.45 05/27/2021 1027    HGB 13.2 07/06/2023 1413    HCT 39.8 07/06/2023 1413    PLT 371 07/06/2023 1413    MCV 93 07/06/2023 1413    MCH 30.7 07/06/2023 1413    MCH 30.6 05/27/2021 1027    MCHC 33.2 07/06/2023 1413    MCHC 34.1 05/27/2021 1027    RDW 11.9 07/06/2023 1413    LYMPHSABS 2.2 05/27/2021 1027    MONOABS 0.6 05/27/2021 1027    EOSABS 0.1 05/27/2021 1027    BASOSABS 0.0 05/27/2021 1027        CMP     Labs (Brief)          Component Value Date/Time    NA 142 10/12/2023 1446    K 3.9 10/12/2023 1446    CL 103 10/12/2023 1446     CO2 22 10/12/2023 1446    GLUCOSE 100 (H) 10/12/2023 1446    GLUCOSE 110 (H) 09/07/2023 0819    BUN 12 10/12/2023 1446    CREATININE 0.63 10/12/2023 1446    CREATININE 0.67 05/27/2021 1027    CALCIUM 9.5 10/12/2023 1446    PROT 6.9 10/12/2023 1446    ALBUMIN 4.6 10/12/2023 1446    AST 34 10/12/2023 1446    AST 27 05/27/2021 1027    ALT 36 (H) 10/12/2023 1446    ALT 23 05/27/2021 1027    ALKPHOS 49 10/12/2023 1446    BILITOT <0.2 10/12/2023 1446    BILITOT 0.4 05/27/2021 1027    GFRNONAA >60 09/07/2023 0819    GFRNONAA >60 05/27/2021 1027        Assessment and Plan:    Glorene Cannon is a 53 y.o. y/o female has been referred for    1.  Upper abdominal pain - Check H. Pylori stool test. - Start OTC Pepcid 20mg  BID - Avoid NSAIDs,  - Recommend Lifestyle Modifications to prevent Acid Reflux.  Rec. Avoid coffee, sodas, peppermint, garlic, onions, alcohol, citrus fruits, chocolate, tomatoes, fatty and spicey foods.  Avoid eating 2-3 hours before bedtime.   - EGD if symptoms worsen or persist.   2.  Hepatic steatosis - Recommend a low-fat diet, regular exercise, and weight loss. Patient education handout about fatty liver disease was given and discussed from up-to-date.  LFTs have been looked fairly normal.  She denies alcohol use.    Fibrosis 4 Score = .78 (Low risk)        Interpretation for patients with NAFLD          <1.30       -  F0-F1 (Low risk)          1.30-2.67 -  Indeterminate           >2.67      -  F3-F4 (High risk)     Validated for ages 34-65   3.  Benign liver hemangioma - Reassurance.  Patient education discussed.   4.  Constipation - Start MiraLAX 1 capful 17 g in a drink once daily. - High-fiber diet.  30 g of dietary fiber daily. - Drink 64 ounces of fluids daily.   5.  Colon cancer screening - Scheduling Colonoscopy I discussed risks of colonoscopy with patient to include risk of bleeding, colon perforation, and risk of sedation.   Patient expressed understanding and agrees to proceed with colonoscopy.    Follow up in 3 months.   Brigitte Canard, PA-C

## 2024-01-10 NOTE — Progress Notes (Signed)
 Interpreter used today at the [facility] for this pt.  Interpreter's name is- Belva Boyden  Pt's states no medical or surgical changes since previsit or office visit.

## 2024-01-10 NOTE — Patient Instructions (Signed)
 Interpreter used today at the [facility] for this pt.  Interpreter's name is-Interpreter used today at the [facility] for this pt.  Interpreter's name is-Interpreter used today at the [facility] for this pt.  Interpreter's name is-Olga Claton.  Resume all of your previous medicines today as ordered. Read your discharge instructions.  USTED TUVO UN PROCEDIMIENTO ENDOSCPICO HOY EN EL [facility]:   Lea el informe del procedimiento que se le entreg para cualquier pregunta especfica sobre lo que se Dentist.  Si el informe del examen no responde a sus preguntas, por favor llame a su gastroenterlogo para aclararlo.  Si usted solicit que no se le den [employer] de lo que se Clinical cytogeneticist en su procedimiento al [employer] va a cuidar, entonces el informe del procedimiento se ha incluido en un sobre sellado para que usted lo revise despus cuando le sea ms conveniente.   LO QUE PUEDE ESPERAR: Algunas sensaciones de hinchazn en el abdomen.  Puede tener ms gases de lo normal.  El caminar puede ayudarle a eliminar el aire que se le puso en el tracto gastrointestinal durante el procedimiento y reducir la hinchazn.  Si le hicieron una endoscopia inferior (como una colonoscopia o una sigmoidoscopia flexible), podra notar manchas de sangre en las heces fecales o en el papel higinico.  Si se someti a una preparacin intestinal para su procedimiento, es posible que no tenga una evacuacin intestinal normal durante [employer].   Tenga en cuenta:  Es posible que note un poco de irritacin y congestin en la nariz o algn drenaje.  Esto es debido al oxgeno [employer] durante su procedimiento.  No hay que preocuparse y esto debe desaparecer ms o Regulatory affairs officer.   SNTOMAS PARA REPORTAR INMEDIATAMENTE:  Despus de una endoscopia inferior (colonoscopia o sigmoidoscopia flexible):  Cantidades excesivas de sangre en las heces fecales   Sensibilidad significativa o empeoramiento de los dolores abdominales   Hinchazn aguda del abdomen que antes no tena   Fiebre de 100F o ms   Para asuntos urgentes o de Associate Professor, puede comunicarse con un gastroenterlogo a cualquier hora llamando al [phone].  DIETA:  Recomendamos una comida pequea al principio, pero luego puede continuar con su dieta normal.  Tome muchos lquidos, pero debe evitar las bebidas alcohlicas durante 24 horas.    ACTIVIDAD:  Debe planear tomarse las cosas con calma por el resto del da y no debe CONDUCIR ni usar maquinaria pesada Patent examiner (debido a los medicamentos de sedacin utilizados durante el examen).     SEGUIMIENTO: Nuestro personal llamar al nmero que aparece en su historial al siguiente da hbil de su procedimiento para ver cmo se siente y para responder cualquier pregunta o inquietud que pueda tener con respecto a la informacin que se le dio despus del procedimiento. Si no podemos contactarle, le dejaremos un mensaje.  Sin embargo, si se siente bien y no tiene English as a second language teacher, no es necesario que nos devuelva la llamada.  Asumiremos que ha regresado a sus actividades diarias normales sin incidentes. Si se le tomaron algunas biopsias, le contactaremos por telfono o por carta en las prximas 3 semanas.  Si no ha sabido [employer] biopsias en el transcurso de 3 semanas, por favor llmenos al [phone].   FIRMAS/CONFIDENCIALIDAD: Usted y/o el acompaante que le cuide han firmado documentos que se ingresarn en su historial mdico Forensic scientist.  Estas firmas atestiguan el hecho de que la  informacin anterior

## 2024-01-10 NOTE — Op Note (Signed)
 [facility] Patient Name: Kathryn Cannon Procedure Date: 01/10/2024 1:56 PM MRN: [id number] Endoscopist: Murel Arlington. Elvin Hammer , MD, [id number] Age: 53 Referring MD:  Date of Birth: 07-Jul-1971 Gender: Female Account #: [id number] Procedure:                Colonoscopy Indications:              Screening for colorectal malignant neoplasm Medicines:                Monitored Anesthesia Care Procedure:                Pre-Anesthesia Assessment:                           - Prior to the procedure, a History and Physical                            was performed, and patient medications and                            allergies were reviewed. The patient's tolerance of                            previous anesthesia was also reviewed. The risks                            and benefits of the procedure and the sedation                            options and risks were discussed with the patient.                            All questions were answered, and informed consent                            was obtained. Prior Anticoagulants: The patient has                            taken no anticoagulant or antiplatelet agents.                            After reviewing the risks and benefits, the patient                            was deemed in satisfactory condition to undergo the                            procedure.                           After obtaining informed consent, the colonoscope                            was passed under direct vision. Throughout the  procedure, the patient's blood pressure, pulse, and                            oxygen saturations were monitored continuously. The                            CF [id number] #[id number] was introduced through the anus                            and advanced to the the cecum, identified by                            appendiceal orifice and ileocecal valve. The                            ileocecal valve, appendiceal  orifice, and rectum                            were photographed. The quality of the bowel                            preparation was excellent. The colonoscopy was                            performed without difficulty. The patient tolerated                            the procedure well. The bowel preparation used was                            SUPREP via split dose instruction. Scope In: 2:37:54 PM Scope Out: 2:48:04 PM Scope Withdrawal Time: 0 hours 6 minutes 17 seconds  Total Procedure Duration: 0 hours 10 minutes 10 seconds  Findings:                 The entire examined colon appeared normal on direct                            and retroflexion views. Complications:            No immediate complications. Estimated blood loss:                            None. Estimated Blood Loss:     Estimated blood loss: none. Impression:               - The entire examined colon is normal on direct and                            retroflexion views.                           - No specimens collected. Recommendation:           - Repeat colonoscopy in 10 years for screening  purposes.                           - Patient has a contact number available for                            emergencies. The signs and symptoms of potential                            delayed complications were discussed with the                            patient. Return to normal activities tomorrow.                            Written discharge instructions were provided to the                            patient.                           - Resume previous diet.                           - Continue present medications. Murel Arlington. Elvin Hammer, MD 01/10/2024 2:51:09 PM This report has been signed electronically.

## 2024-01-10 NOTE — Progress Notes (Signed)
 A/o x 3, VSS, gd SR's, pleased with anesthesia, report to RN

## 2024-01-11 ENCOUNTER — Telehealth: Payer: Self-pay

## 2024-01-11 NOTE — Telephone Encounter (Signed)
  Follow up Call-     01/10/2024    2:08 PM  Call back number  Post procedure Call Back phone  # [phone]  Permission to leave phone message Yes     Patient questions:  Do you have a fever, pain , or abdominal swelling? No. Pain Score  0 *  Have you tolerated food without any problems? Yes.    Have you been able to return to your normal activities? Yes.    Do you have any questions about your discharge instructions: Diet   No. Medications  No. Follow up visit  No.  Do you have questions or concerns about your Care? No.  Actions: * If pain score is 4 or above: No action needed, pain <4.  [employer]

## 2024-02-08 ENCOUNTER — Ambulatory Visit: Payer: Self-pay | Admitting: Obstetrics and Gynecology

## 2024-02-08 ENCOUNTER — Encounter: Payer: Self-pay | Admitting: Obstetrics and Gynecology

## 2024-02-08 VITALS — BP 128/79 | HR 71 | Wt 151.3 lb

## 2024-02-08 DIAGNOSIS — Z8742 Personal history of other diseases of the female genital tract: Secondary | ICD-10-CM

## 2024-02-08 DIAGNOSIS — Z853 Personal history of malignant neoplasm of breast: Secondary | ICD-10-CM

## 2024-02-08 DIAGNOSIS — N840 Polyp of corpus uteri: Secondary | ICD-10-CM

## 2024-02-08 DIAGNOSIS — Z9229 Personal history of other drug therapy: Secondary | ICD-10-CM

## 2024-02-08 DIAGNOSIS — N6092 Unspecified benign mammary dysplasia of left breast: Secondary | ICD-10-CM

## 2024-02-08 DIAGNOSIS — Z758 Other problems related to medical facilities and other health care: Secondary | ICD-10-CM

## 2024-02-08 DIAGNOSIS — Z603 Acculturation difficulty: Secondary | ICD-10-CM

## 2024-02-13 NOTE — Progress Notes (Signed)
 Obstetrics and Gynecology Visit Return Patient Evaluation  Appointment Date: 02/10/2024  Primary Care Provider: Nooruddin, Kathryn Cannon  OBGYN Clinic: Center for [facility]  Chief Complaint: follow up post menopausal bleeding with endometrial polyps  History of Present Illness:  Kathryn Cannon is a 53 y.o. 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 s/p 1/22 hysteroscopy, d&c for post menopausal bleeding and a h/o tamoxifen  usage (05/2017-05/2022).    Interval History: no spotting, bleeding, pain, vaginal dishcharge since last visit. Patient last seen 10/12/23 for her routine post op visit. Plan at that time was expectant management and follow up in six months for any recurrent PMB s/s. I did talk to her Oncology Team Kathryn Kendall, NP) and they said that a Mirena IUD would be acceptable from their standpoint, if I felt it was needed.  Prior History: She was seen by [facility] 06/09/23 referred for an appointment today due to AUB. She had a pap smear three years ago that was cytology negative and HPV negative so she was told she didn't need another one. Per Heme/Onc, she only needed SBEs and yearly mammograms   Given her history of tamoxifen  usage, PMB status and slightly thickened endometrium, h/s d&c recommended regardless of embx findings. Her 06/2023 EMBx from that visit showed scanty inactive endometrium with abundant blood. On h/s, she had atrophic appearing findings on hysteroscopy but with moderate amount of curettings. Disordered proliferative phase endometrium and a benign polyp on final pathology.   Review of Systems: as noted in the History of Present Illness.  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 Current Outpatient Medications  Medication Sig Dispense Refill   acetaminophen  (TYLENOL ) 500 MG tablet Take 1,000 mg by mouth every 8 (eight) hours as needed for headache.     fenofibrate  (TRICOR ) 48 MG tablet Take 1 tablet (48 mg total) by mouth daily. 90 tablet 3   Heating Pads PADS 1 Units by Does not apply route as needed.     Magnesium Oxide 500 MG CAPS Take 500 mg by mouth daily as needed.     benzonatate (TESSALON) 100 MG capsule Take 100 mg by mouth. (Patient not taking: Reported on 02/08/2024)     No current facility-administered medications for this visit.    Allergies: has no known allergies.  Physical Exam:  BP 128/79   Pulse 71   Wt 151 lb 4.8 oz (68.6 kg)   BMI 28.59 kg/m  Body mass index is 28.59 kg/m. General appearance: Well nourished, well developed female in no acute distress.  Neuro/Psych:  Normal mood and affect.     Assessment: patient stable  Plan:  1. History of postmenopausal bleeding I told her that her tamoxifen  was needed but it is a known cause of endometrial polyps. I told her that she doesn't have any other risk factors for recurrent endometrial polyps,  such as elevated BMI, HTN, DM2, so I feel that now that the polyps shouldn't reocurr since she is no longer on Tamoxfen. I told her that if she was concerned about recurrance that the only endometrial surveillance that I would trust, in her case, is an OR D&C given the discordant findings on her last embx vs d&c. I also told her that a Mirena without any  endometrial surveillance is an option, but I told her that I don't feel that she needs anything at this point, except for expectant management. She is amenable to expectant management and she will let us  know if she ever has any spotting or bleeding in the future.   2. History of tamoxifen  therapy See above  3. Atypical ductal hyperplasia of left breast See above  4. Language barrier In person interpreter used   Return if symptoms worsen or fail to improve.  No future appointments.   Bebe Izell Raddle MD Attending Center for [employer] [facility])

## 2024-02-22 ENCOUNTER — Telehealth: Payer: Self-pay | Admitting: Family Medicine

## 2024-02-22 NOTE — Telephone Encounter (Signed)
 Patient was seen on June 26 and she said she was told to call back if the bleeding began again. She is concerned that her follow up appt that we are scheduling now is too far away. She would like to know what she can do about the bleeding in the mean time.

## 2024-02-27 NOTE — Telephone Encounter (Signed)
 Called pt with Kathryn Cannon, spanish interpreter, and pt reports that she has been having bleeding since Tuesday of last week like a period and is having to wear a pad.    Andersson Larrabee,RN

## 2024-03-05 ENCOUNTER — Telehealth: Payer: Self-pay

## 2024-03-05 NOTE — Telephone Encounter (Signed)
 Health Coaching 3  interpreter- Bernice Angry, [employer]   Goals- Continue practicing a heart healthy diet. Watching the amount of fried and fatty food consumed. Eating more lean proteins like chicken or fish. Patient is still eating a variety of fruits and vegetables. Patient is still taking cholesterol medication as prescribed. Encouraged patient to continue to try and get 20-30 minutes of exercise daily.       Navigation:  Patient is aware of  a follow up session. Patient is scheduled for FU on 04/06/24.

## 2024-03-05 NOTE — Telephone Encounter (Signed)
Left message for patient about completing HC 3 for the [facility] program. Left name and number for patient to call back. 

## 2024-03-09 ENCOUNTER — Ambulatory Visit: Payer: Self-pay | Admitting: Obstetrics and Gynecology

## 2024-03-09 ENCOUNTER — Encounter: Payer: Self-pay | Admitting: Obstetrics and Gynecology

## 2024-03-09 ENCOUNTER — Other Ambulatory Visit: Payer: Self-pay

## 2024-03-09 VITALS — BP 156/98 | HR 69 | Wt 150.3 lb

## 2024-03-09 DIAGNOSIS — N841 Polyp of cervix uteri: Secondary | ICD-10-CM

## 2024-03-09 DIAGNOSIS — Z758 Other problems related to medical facilities and other health care: Secondary | ICD-10-CM

## 2024-03-09 DIAGNOSIS — Z9229 Personal history of other drug therapy: Secondary | ICD-10-CM

## 2024-03-09 DIAGNOSIS — N6092 Unspecified benign mammary dysplasia of left breast: Secondary | ICD-10-CM

## 2024-03-09 DIAGNOSIS — Z8742 Personal history of other diseases of the female genital tract: Secondary | ICD-10-CM

## 2024-03-09 DIAGNOSIS — Z603 Acculturation difficulty: Secondary | ICD-10-CM

## 2024-03-09 NOTE — Progress Notes (Signed)
 Obstetrics and Gynecology Visit Return Patient Evaluation  Appointment Date: 03/09/2024  Primary Care Provider: Nooruddin, Saad  OBGYN Clinic: Center for [facility]  Chief Complaint: PMB July 7th x 8 days  History of Present Illness:  Kathryn Cannon is a 53 y.o. 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 s/p 1/22 hysteroscopy (disordered proliferative phase endometrium and benign endometrial polyp seen), d&c for post menopausal bleeding and a h/o tamoxifen  usage (05/2017-05/2022), h/o c/s x 3  Interval History: I saw her on 6/25 for routine follow up and she was doing well at that time and plan was just expectant management and for her to let us  know if she had any repeat s/s at that time.  She states that starting on July 7th she had bleeding like a period for eight days. She states she also had menstrual pains with it as well as bilateral breast tenderness and at that spot of the prior lumpectomy, no nipple discharge.    Prior History: She was seen by [facility] 06/09/23 referred for an appointment today due to AUB. She had a pap smear three years ago that was cytology negative and HPV negative so she was told she didn't need another one. Per Heme/Onc, she only needed SBEs and yearly mammograms   Given her history of tamoxifen  usage, PMB status and slightly thickened endometrium, h/s d&c recommended regardless of embx findings. Her 06/2023 EMBx from that visit showed scanty inactive endometrium with abundant blood. On h/s, she had atrophic appearing findings on hysteroscopy but with moderate amount of curettings. Disordered proliferative phase endometrium and a benign polyp on final pathology.   Patient last seen 10/12/23 for her routine post op visit. Plan at that time was expectant management and follow up in six months for any recurrent PMB s/s. I did talk to her  Oncology Team Maryjane Kendall, NP) and they said that a Mirena IUD would be acceptable from their standpoint, if I felt it was needed.  Review of Systems: as noted in the History of Present Illness.  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 Current Outpatient Medications  Medication Sig Dispense Refill   acetaminophen  (TYLENOL ) 500 MG tablet Take 1,000 mg by mouth every 8 (eight) hours as needed for headache.     fenofibrate  (TRICOR ) 48 MG tablet Take 1 tablet (48 mg total) by mouth daily. 90 tablet 3   Heating Pads PADS 1 Units by Does not apply route as needed.     Magnesium Oxide 500 MG CAPS Take 500 mg by mouth daily as needed.     benzonatate (TESSALON) 100 MG capsule Take 100 mg by mouth. (Patient not taking: Reported on 03/09/2024)     No current facility-administered medications for this visit.    Allergies: has no known allergies.  Physical Exam:  BP (!) 156/98   Pulse 69  Wt 150 lb 4.8 oz (68.2 kg)   BMI 28.40 kg/m  Body mass index is 28.4 kg/m. General appearance: Well nourished, well developed female in no acute distress.  Neuro/Psych:  Normal mood and affect.     Assessment: patient stable  Plan:  1. History of postmenopausal bleeding I told her that her situation is a grey area and that she definitely needs at least endometrial sampling. I told her that  since the results were so discordant last time (in office embx vs what was seen at her OR hysteroscopy d&c) that I recommend the OR for sampling. I also told her that since she had recurrent PMB, especially with the amount and s/s associated with it, that a hysterectomy is also reasonable, to exclude occult dysplasia. I d/w her re: hysterectomy and that it's a major surgery, recovery time etc and also re: potentially oophorectomy given her age vs leaving her ovaries. I told her that if we did OR hysteroscopy d&c I recommend Mirena placement at that time, which is okay, per her breast cancer team.   After d/w her, she is amenable with proceeding with hysteroscopy, d&c, mirena placement. I told her that it's not common to have some AUB for a few months after an IUD but that if she has continued, persistent AUB then I would recommend a hysterectomy given risk of occult dysplasia that isn't being detected with hysteroscopy d&c.   Message sent to office to see if Mirena is covered with her patient assistance. If not, will try [employer] application.   2. History of tamoxifen  therapy See above  3. Atypical ductal hyperplasia of left breast See above  4. Language barrier In person interpreter used Jinnie)  Return if symptoms worsen or fail to improve.  Future Appointments  Date Time Provider Department Center  04/06/2024 10:30 AM [facility] None     Bebe Izell Raddle MD Attending Center for [facility])

## 2024-03-14 ENCOUNTER — Telehealth: Payer: Self-pay

## 2024-03-14 NOTE — Telephone Encounter (Signed)
 Called pt with Spanish Interpreter Eda R., and informed pt that the provider would like for her to come pick up a application for a free Mirena.  Pt stated that she would be able to come today to pick up application.  Pt advised that she will need to return document with the required income verification.  Pt verbalized understanding.  Application placed at front desk.    Blas Riches,RN  03/14/24

## 2024-03-14 NOTE — Telephone Encounter (Signed)
-----   Message from [place] sent at 03/13/2024 10:07 AM EDT ----- Regarding: RE: mirena placement and hysteroscopy d&c Agreed!   Main clinic folks, can y'all call her and have her come in for her Mirena application?  Jacinda, can you see how long her orange card is active for?   Thanks, everyone. ----- Message ----- From: Kelle Caroleen RAMAN Sent: 03/13/2024   9:14 AM EDT To: Bebe Furry, MD; Benita Gains Subject: RE: mirena placement and hysteroscopy d&c      Correct, that orange card is essentially cone writing off the cost, and I would much rather the drug company cover the cost of the device than us . ----- Message ----- From: Furry Bebe, MD Sent: 03/09/2024  11:36 AM EDT To: Caroleen RAMAN Kelle; Benita Gains Subject: RE: mirena placement and hysteroscopy d&c      Okay, so it's not covered by the Davene Crigler card or whatever assistance program this patient is on? ----- Message ----- From: Kelle Caroleen RAMAN Sent: 03/09/2024   9:33 AM EDT To: Bebe Furry, MD; Benita Gains Subject: RE: mirena placement and hysteroscopy d&c      For Mirena we like for them to apply for [facility] foundation with [employer]. The device is shipped to the office so we don't have to write off the cost of the device. ----- Message ----- From: Furry Bebe, MD Sent: 03/09/2024   8:54 AM EDT To: Caroleen RAMAN Kelle; Benita Gains Subject: mirena placement and hysteroscopy d&c          Is her mirena covered by the patient assistance program which she is in currently? thanks

## 2024-04-04 ENCOUNTER — Telehealth: Payer: Self-pay | Admitting: *Deleted

## 2024-04-04 NOTE — Telephone Encounter (Deleted)
-----   Message from [place] B sent at 04/02/2024  4:54 PM EDT ----- Regarding: RE: mirena placement and hysteroscopy d&c Following up on this, what is the status of her device? Did she get approved? Has it been shipped to us ? ----- Message ----- From: Izell Harari, MD Sent: 03/13/2024  10:07 AM EDT To: Caroleen GORMAN Pastures; Benita Gains; [facility] C# Subject: RE: mirena placement and hysteroscopy d&c      Agreed!   Main clinic folks, can y'all call her and have her come in for her Mirena application?  Jacinda, can you see how long her orange card is active for?   Thanks, everyone. ----- Message ----- From: Pastures Caroleen GORMAN Sent: 03/13/2024   9:14 AM EDT To: Harari Izell, MD; Benita Gains Subject: RE: mirena placement and hysteroscopy d&c      Correct, that orange card is essentially cone writing off the cost, and I would much rather the drug company cover the cost of the device than us . ----- Message ----- From: Izell Harari, MD Sent: 03/09/2024  11:36 AM EDT To: Caroleen GORMAN Pastures; Benita Gains Subject: RE: mirena placement and hysteroscopy d&c      Okay, so it's not covered by the Davene Crigler card or whatever assistance program this patient is on? ----- Message ----- From: Pastures Caroleen GORMAN Sent: 03/09/2024   9:33 AM EDT To: Harari Izell, MD; Benita Gains Subject: RE: mirena placement and hysteroscopy d&c      For Mirena we like for them to apply for [facility] foundation with [employer]. The device is shipped to the office so we don't have to write off the cost of the device. ----- Message ----- From: Izell Harari, MD Sent: 03/09/2024   8:54 AM EDT To: Caroleen GORMAN Pastures; Benita Gains Subject: mirena placement and hysteroscopy d&c          Is her mirena covered by the patient assistance program which she is in currently? thanks

## 2024-04-04 NOTE — Telephone Encounter (Signed)
 I called patient with Interpreter Eda Royal and she confirms she came and got the application and filled it out and turned it in on the same day they called her. She states she turned in her financial paperwork as required. I informed her we were following up and have not received the IUD yet.  Rock Skip PEAK

## 2024-04-05 NOTE — Progress Notes (Unsigned)
 [facility] follow up   Interpreter: Bernice Angry, CHERRI  Clinical Measurement:   Vitals:   04/06/24 1318 04/06/24 1319  BP: (!) 147/95 (!) 148/98      Medical History: Patient states that she has high cholesterol, does not have high blood pressure and she does not have diabetes. Patient states that she does not have history of gestational hypertension, does not have history of pre-eclampsia/eclampsia and she does not have history of gestational diabetes.     Medications: Patient states that she does take medication to lower cholesterol, blood pressure and blood sugar.  Patient does not take an aspirin a day to help prevent a heart attack or stroke. During the past 7 days patient has taken prescribed medication to lower cholesterol on 7 days.   Blood pressure, self measurement: Patient states that she does measure blood pressure from home. She checks her blood pressure a few times per week. She shares her readings with a health care provider: yes.   Nutrition: Patient states that on average she eats 0 cups of fruit and 1 cups of vegetables per day. Patient states that she does eat fish at least 2 times per week. Patient eats about half servings of whole grains. Patient drinks less than 36 ounces of beverages with added sugar weekly: yes. Patient is currently watching sodium or salt intake: yes. In the past 7 days patient has had 0 drinks containing alcohol. On average patient drinks 0 drinks containing alcohol per day.      Physical activity: Patient states that she gets 0 minutes of physical activity each week.  Smoking status: Patient states that she has has never smoked .   Quality of life: Over the past 2 weeks patient states that she had little interest or pleasure in doing things: not at all. She has been feeling down, depressed or hopeless:not at all.   Social Determinants of Health Assessment:   Computer Use: During the last 12 months patient states that she has used any of the  following: desktop/laptop, smart phone or tablet/other portable wireless computer: yes.   Internet Use: During the last 12 months, did you or any member of your household have access to the internet: Yes, by paying a cell phone company or internet service provider.   Food Insecurities: During the last 12 months, where there any times when you were worried that you would run out of food because of a lack of money or other resources: No.   Transportation Barriers: During the last 12 months, have you missed a doctor's appointment because of transportation problems: No.   Childcare Barriers: If you are currently using childcare services, please identify  the type of services you use. (If not using childcare services, please select Not applicable): not applicable. During the last 12 months, have you had any barriers to childcare services such as: not applicable.   Housing: What is your housing situation today: I have housing.   Intimate Partner Violence: During the last 12 months, how often did your partner physically hurt you: never. During the last 12 months, how often did your partner insult you or talk down to you: never.  Medication Adherence: During the last 12 months, did you ever forget to take your medicine: No. During the last 12 months, were you careless ar times about taking your medicine: No. During the last 12 months, when you felt better did you sometimes stop taking your medication: No. During the last 12 months, sometimes if you felt worse when  you took your medicine did you stop taking it: No.    Risk reduction and counseling: Offered to refer patient for FU for elevated BP. Patient states that her BP is always high when she goes for doctors appointments. Patient states that doctors appointments make her nervous. Patient states that she is still using the monitor that I gave her from [facility]. She says that she has been checking her blood pressure a couple of times per week.  Encouraged patient to keep a log of her readings over the next few weeks. Instructed patient to call me if she is seeing consistent readings of >140/90. If her BP is still elevated when she comes for her re-screening in October I will refer her back to her PCP for FU. Patient was agreeable with this plan.    Navigation: This was the  follow up session for this patient, I will check up on her progress in the coming months.

## 2024-04-06 ENCOUNTER — Other Ambulatory Visit: Payer: Self-pay

## 2024-04-06 ENCOUNTER — Inpatient Hospital Stay: Payer: Self-pay | Attending: Obstetrics and Gynecology | Admitting: *Deleted

## 2024-04-06 VITALS — BP 148/98 | Ht 61.0 in | Wt 148.0 lb

## 2024-04-06 DIAGNOSIS — Z0000 Encounter for general adult medical examination without abnormal findings: Secondary | ICD-10-CM

## 2024-04-06 DIAGNOSIS — Z1231 Encounter for screening mammogram for malignant neoplasm of breast: Secondary | ICD-10-CM

## 2024-04-24 ENCOUNTER — Telehealth: Payer: Self-pay | Admitting: Family Medicine

## 2024-04-24 NOTE — Telephone Encounter (Signed)
 Spanish Speaking Patient:     said she received a call from the [facility], she is not understanding what they are asking for, something about things missing from her application

## 2024-04-25 NOTE — Telephone Encounter (Signed)
 RN spoke with [employer] Patient [employer] employee in regards to missing information from patient application.  Employee stated that page #6 from application needs to be filled out my healthcare provider and faxed back to [phone].    Waddell, RN

## 2024-04-25 NOTE — Telephone Encounter (Addendum)
 Page 6 of Healthcare Professional Section and [employer] for Mirena Signed by Dr. Eldonna and faxed.   Waddell, RN    04/25/24 11:29 Called pt to inform her of application correction and that paper was faxed to [employer]. Entire phone call interpreted by [employer] [phone].  Waddell, RN

## 2024-05-15 ENCOUNTER — Ambulatory Visit (INDEPENDENT_AMBULATORY_CARE_PROVIDER_SITE_OTHER): Payer: Self-pay | Admitting: Nurse Practitioner

## 2024-05-15 ENCOUNTER — Encounter: Payer: Self-pay | Admitting: Nurse Practitioner

## 2024-05-15 VITALS — BP 120/70 | HR 54 | Ht 61.0 in | Wt 147.0 lb

## 2024-05-15 DIAGNOSIS — R1013 Epigastric pain: Secondary | ICD-10-CM

## 2024-05-15 DIAGNOSIS — K760 Fatty (change of) liver, not elsewhere classified: Secondary | ICD-10-CM

## 2024-05-15 DIAGNOSIS — Z8719 Personal history of other diseases of the digestive system: Secondary | ICD-10-CM

## 2024-05-15 NOTE — Progress Notes (Signed)
 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   She was last seen in office by Ellouise Console PA-C on 01/03/2024 due to having epigastric pain, constipation and abdominal bloat. H. Pylori stool antigen test was ordered but was not completed. She underwent a colonoscopy 01/10/2024 which was normal. She took Omeprazole x 30 days then an additional week but she didn't feel well on it, felt like she had regurgitation x 2 days so she stopped it.   She presents today for further GI follow up. She speaks Spanish therefore she is accompanied by a [place] Spanish interpretor who facilitated communication throughout today's office visit .She denies having any further epigastric pain. She denies having any heartburn. No dysphagia. She is passing normal stools. No bloody or black stools.   She is aware of having a fatty liver per RUQ sono 09/2023. She is attempting to lose weight. Abdominal MRI 12/2023 confirmed the liver lesion seen on sono was a hemangioma. ALT level mildly elevated at 36 on 10/12/2023. No alcohol use.       Latest Ref Rng & Units 07/06/2023    2:13 PM 05/27/2021   10:27 AM 09/02/2020   11:06 AM  CBC  WBC 3.4 - 10.8 x10E3/uL 5.7  6.0  4.2   Hemoglobin 11.1 - 15.9 g/dL 86.7  86.3  87.0   Hematocrit 34.0 - 46.6 % 39.8  39.9  38.1   Platelets 150 - 450 x10E3/uL 371  201  246        Latest Ref Rng & Units 10/12/2023    2:46 PM 09/07/2023    8:19 AM 05/25/2023    9:02 AM  CMP  Glucose 70 - 99 mg/dL 899  889  896   BUN 6 - 24 mg/dL 12  12    Creatinine 9.42 - 1.00 mg/dL 9.36  9.46    Sodium 865 - 144 mmol/L 142  139    Potassium 3.5 - 5.2 mmol/L 3.9  3.8     Chloride 96 - 106 mmol/L 103  106    CO2 20 - 29 mmol/L 22  25    Calcium 8.7 - 10.2 mg/dL 9.5  9.4    Total Protein 6.0 - 8.5 g/dL 6.9  7.4    Total Bilirubin 0.0 - 1.2 mg/dL <9.7  0.7    Alkaline Phos 44 - 121 IU/L 49  38    AST 0 - 40 IU/L 34  34    ALT 0 - 32 IU/L 36  33      09/2023 RUQ abdominal ultrasound: Hepatic steatosis.  2 cm hypoechoic mass in the left hepatic lobe.  Normal gallbladder.  No gallstones.  CBD 4.2 mm.   12/2023 abdominal MRI with and without contrast: Moderate diffuse hepatic steatosis.  1.2 x 1.8 cm benign hemangioma in the left hepatic lobe.  No other liver lesions.  Normal gallbladder, pancreas, spleen, kidneys, stomach, and intestines.  2 uterine fibroids.   GI PROCEDURES:  Colonoscopy 01/10/2024 by Dr. Abran: - The entire examined colon is normal on direct and retroflexion views.  - No specimens collected. - Recall colonoscopy 10 years   Past Medical History:  Diagnosis Date   Abnormal uterine bleeding (AUB)  Headache    History of atypical hyperplasia of breast 03/2017   oncology--- dr lorriane  lindsey causey np;   dx 08/ 2018  s/p left breast lumpectomy ,  left breast atypical ductal hyperplasia,  started tamoxifen  10/ 2018 and completed 5 yrs 10/ 2023 releasese by oncology   History of tamoxifen  therapy    started 10/ 2018  completed 10/ 2023   Hyperlipidemia    Pre-diabetes    Past Surgical History:  Procedure Laterality Date   BREAST LUMPECTOMY WITH RADIOACTIVE SEED LOCALIZATION Left 04/27/2017   Procedure: RADIOCATIVE SEED GUIDED LEFT BREAST LUMPECTOMY, ERAS PATHWAY;  Surgeon: Curvin Deward MOULD, MD;  Location: [facility] OR;  Service: General;  Laterality: Left;   CESAREAN SECTION     3 previous   HYSTEROSCOPY WITH D & C N/A 09/07/2023   Procedure: DILATATION AND CURETTAGE /HYSTEROSCOPY;  Surgeon: Izell Harari, MD;  Location: [facility];  Service: Gynecology;  Laterality: N/A;   Current Outpatient Medications on File Prior to  Visit  Medication Sig Dispense Refill   acetaminophen  (TYLENOL ) 500 MG tablet Take 1,000 mg by mouth every 8 (eight) hours as needed for headache.     fenofibrate  (TRICOR ) 48 MG tablet Take 1 tablet (48 mg total) by mouth daily. 90 tablet 3   Magnesium Oxide 500 MG CAPS Take 500 mg by mouth daily as needed.     No current facility-administered medications on file prior to visit.   No Known Allergies  Current Medications, Allergies, Past Medical History, Past Surgical History, Family History and Social History were reviewed in [employer] record.  Review of Systems:   Constitutional: Negative for fever, sweats, chills or weight loss.  Respiratory: Negative for shortness of breath.   Cardiovascular: Negative for chest pain, palpitations and leg swelling.  Gastrointestinal: See HPI.  Musculoskeletal: Negative for back pain or muscle aches.  Neurological: Negative for dizziness, headaches or paresthesias.   Physical Exam: There were no vitals taken for this visit. BP 120/70   Pulse (!) 54   Ht 5' 1 (1.549 m)   Wt 147 lb (66.7 kg)   BMI 27.78 kg/m   Wt Readings from Last 3 Encounters:  05/15/24 147 lb (66.7 kg)  04/06/24 148 lb (67.1 kg)  03/09/24 150 lb 4.8 oz (68.2 kg)   01/10/2024     152 lbs  General: 53 year old female in no acute distress. Head: Normocephalic and atraumatic. Eyes: No scleral icterus. Conjunctiva pink . Ears: Normal auditory acuity. Mouth: Dentition intact. No ulcers or lesions.  Lungs: Clear throughout to auscultation. Heart: Regular rate and rhythm, no murmur. Abdomen: Soft, nontender and nondistended. No masses or hepatomegaly. Normal bowel sounds x 4 quadrants.  Rectal: Deferred.  Musculoskeletal: Symmetrical with no gross deformities. Extremities: No edema. Neurological: Alert oriented x 4. No focal deficits.  Psychological: Alert and cooperative. Normal mood and affect  Assessment and Recommendations:  Colon cancer  screening. Normal colonoscopy 12/2023.  - Next colonoscopy due 12/2033  Epigastric pain, resolved -Recommending checking H. Pylori stool antigen as previously ordered and schedule an EGD if epigastric pain recurs  Hepatic steatosis. Mildly elevated ALT 36.  - Reduce carbohydrates in diet ie: bread, pasta, rice, potatoes and sweets - Exercise as tolerated and lose weight to reduce the risk of developing fatty liver related disease - Patient is scheduled to have routine laboratory studies with her PCP on 06/04/2024, recommend hepatic panel to be drawn at that time  Left liver hemangioma, MRI  confirmed a 1.2 cm x 1.8 cm hemangioma - No further evaluation required as a hemangioma is a benign liver lesion  Today's encounter was 25 minutes which included precharting, chart/result review, history/exam, face-to-face time used for counseling, formulating a treatment plan with follow-up and documentation.

## 2024-05-15 NOTE — Patient Instructions (Addendum)
 _____________________________________________________  Kathryn Cannon presin arterial en su visita fue de 140/90 o mayor, comunquese con su mdico de atencin primaria para Presenter, broadcasting.  Si tiene 65 aos o ms, su ndice de masa corporal [facility]) debe estar entre 23 y 68. Su IMC es de 27,78 kg/m. Si este valor est fuera del rango mencionado, considere consultar con su mdico de cabecera.  Si tiene [address] [place], su [facility] debe estar entre 19 y 25. Su IMC es de 27,78 kg/m. Si este valor est fuera del rango mencionado, considere consultar con su mdico de cabecera.  Los gastroenterlogos de [employer] recomiendan que utilice MYCHART para comunicarse con otros profesionales sanitarios si tiene alguna consulta o solicitud no urgente. Debido a los Retail buyer de espera por telfono, Corporate treasurer un mensaje a su profesional sanitario por [employer] puede ser una forma ms rpida y eficiente de Designer, industrial/product. El plazo de [place] es de 48 horas hbiles. Recuerde que esto es para consultas no urgentes. _______________________________________________________  [name] lynetta un enfoque de atencin en equipo. Su equipo est formado por su mdico y dos o tres auxiliares mdicos de enfermera (APPS). Nuestros auxiliares mdicos de enfermera (APPS) trabajan con su mdico para garantizar la continuidad de su atencin. Estn totalmente cualificados para abordar sus problemas de salud y Environmental education officer un plan de tratamiento. Se comunican directamente con su gastroenterlogo para atenderle. Consultar con los auxiliares mdicos de enfermera de su equipo mdico puede ayudarle a Statistician una atencin ms rpida, lo que a menudo permite citas ms tempranas, acceso a pruebas diagnsticas, procedimientos y otras derivaciones a especialistas.  Solicite que se Naval architect heptico en el momento de la extraccin de laboratorio con su proveedor de atencin primaria el 20/05/2024  (20 de octubre de  2025) .  Request hepatic panel to be collected at time of lab draw with your primary care provider on 06/04/2024.   Reduzca los carbohidratos en su dieta, como pan, pasta, arroz, papas y dulces. Haga ejercicio segn lo tolere y baje de peso para reducir el riesgo de desarrollar hgado graso.   Reduce the carbohydrates in diet ie: bread/pasta/rice/potatoes and sweets. Exercise as tolerated and lose weight to reduce the risk of developing fatty liver disease.    Contacte con nuestra oficina si el dolor abdominal superior reaparece.  La prxima colonoscopia de deteccin est prevista para mayo de 2035.  Contact our office if your upper abdominal pain recurs   Next screening colonoscopy due May 2035

## 2024-05-15 NOTE — Progress Notes (Signed)
 Noted

## 2024-05-29 ENCOUNTER — Ambulatory Visit: Payer: Self-pay | Admitting: Obstetrics and Gynecology

## 2024-05-29 ENCOUNTER — Other Ambulatory Visit (HOSPITAL_COMMUNITY)
Admission: RE | Admit: 2024-05-29 | Discharge: 2024-05-29 | Disposition: A | Discharge status: Home / Self Care | Payer: Self-pay | Source: Ambulatory Visit | Attending: Obstetrics and Gynecology | Admitting: Obstetrics and Gynecology

## 2024-05-29 ENCOUNTER — Other Ambulatory Visit: Payer: Self-pay

## 2024-05-29 VITALS — BP 142/98 | HR 65 | Wt 149.0 lb

## 2024-05-29 DIAGNOSIS — Z9229 Personal history of other drug therapy: Secondary | ICD-10-CM

## 2024-05-29 DIAGNOSIS — N950 Postmenopausal bleeding: Secondary | ICD-10-CM

## 2024-05-29 DIAGNOSIS — Z8742 Personal history of other diseases of the female genital tract: Secondary | ICD-10-CM

## 2024-05-29 DIAGNOSIS — Z603 Acculturation difficulty: Secondary | ICD-10-CM

## 2024-05-31 LAB — SURGICAL PATHOLOGY

## 2024-05-31 NOTE — Progress Notes (Signed)
 Obstetrics and Gynecology Visit Return Patient Evaluation  Appointment Date: 05/29/2024  Primary Care Provider: Nooruddin, Saad  OBGYN Clinic: [facility]  Chief Complaint: follow up history of post menopausal bleeding, h/o tamoxifen  usage, h/o polyps  History of Present Illness:  Kathryn Cannon is a 53 y.o. with h/o 2018 left breast atypical ductal hyperplasia s/p lumpectomy followed by tamoxifen  from 2018 to October 2023. s/p 09/07/23 hysteroscopy (disordered proliferative phase endometrium and benign endometrial polyp seen), d&c for post menopausal bleeding and a h/o tamoxifen  usage (05/2017-05/2022), h/o c/s x 3    She was last seen 03/09/24 where she endorsed eight days of period like bleeding starting on 7/7. Plan at that time was to proceed with another hysteroscopy, d&c, and Mirena, due discordant findings previously, with in office embx showing scanty inactive endometrium  with abundant blood but hysteroscopy with moderate curettings and path showing disordered proliferative phase endometrium & and a benign polyp. Patient on [employer] only so [employer] Mirena application done, which was eventually approved.   Interval History: Since that time, she states that she denies any bleeding, spotting, pain. Patient here for follow up after Mirena was approved.   Review of Systems:  as noted in the History of Present Illness.   Patient Active Problem List   Diagnosis Date Noted   Liver mass, left lobe 12/20/2023   Pain of midfoot 12/20/2023   Numbness and tingling in right hand 12/20/2023   Polyp of endometrium 10/12/2023   History of postmenopausal bleeding 08/23/2023   History of tamoxifen  therapy 08/23/2023   Language barrier 07/06/2023   Abnormal uterine bleeding (AUB) 07/06/2023   Pre-diabetes 03/27/2020   Hyperlipidemia 03/22/2019   Elevated blood pressure reading 03/22/2019   Screening breast examination 03/01/2019    Atypical ductal hyperplasia of left breast 06/13/2017   Medications:  Kathryn Cannon had no medications administered during this visit. Current Outpatient Medications  Medication Sig Dispense Refill   acetaminophen  (TYLENOL ) 500 MG tablet Take 1,000 mg by mouth every 8 (eight) hours as needed for headache.     fenofibrate  (TRICOR ) 48 MG tablet Take 1 tablet (48 mg total) by mouth daily. 90 tablet 3   Magnesium Oxide 500 MG CAPS Take 500 mg by mouth daily as needed.     No current facility-administered medications for this visit.    Allergies: has no known allergies.  Physical Exam:  BP (!) 142/98   Pulse 65   Wt 149 lb (67.6 kg)   LMP 02/20/2024 (Exact Date)   BMI 28.15 kg/m  Body mass index is 28.15 kg/m. General appearance: Well nourished, well developed female in no acute distress.  Neuro/Psych:  Normal mood and affect.    See procedure note for embx  Assessment: patient stable  Plan:  1. History of postmenopausal bleeding (Primary) Long d/w pt re: next steps. I d/w her re: repeat hysteroscopy, d&c and mirena placement in the OR. I also told her that can do in office sampling with caveat that it may not be as inclusive a sample as with hysteroscopy and place mirena. I told her that the concern could be covert uterine malignant cells. She is wondering if a hysterectomy would be best. I told her that a hyst would give her definitive pathology but it is a major surgery, especially with her c-section history.  After d/w her, she'd like to do in office embx and follow up and talk about the pathology and consider next steps at that time.  I was able to sound her to 8.5 vs 9 in the OR and only 5cm when I did her last embx with discordant pathology findings vs her hysteroscopy d&c pathology, which I told her is reassuring that the results would be similar to what would be obtained in the OR  Follow up in 10-14 days to talk about next steps, possible Mirena placement -  Surgical pathology( [place]/ [facility])  2. History of tamoxifen  therapy See above - Surgical pathology( / [facility])  3. Language barrier In person interpreter used   Return in about 10 days (around 06/08/2024) for in person, with dr izell.  Future Appointments  Date Time Provider Department Center  06/04/2024  8:30 AM [facility] None  06/04/2024  9:20 AM [facility]  06/20/2024  8:15 AM izell Harari, MD [facility]  06/21/2024 10:30 AM [facility] CLINIC [facility] None  06/21/2024 11:20 AM [facility] MM 1 [facility] CE    Harari izell Raddle MD Attending Center for [facility])

## 2024-05-31 NOTE — Procedures (Signed)
 Endometrial Biopsy Procedure Note  Pre-operative Diagnosis: h/o PMB. H/o tamoxifen  usage. H/o thickened endometrial lining  Post-operative Diagnosis: same  Procedure Details  Cervical exam performed in the presence of a chaperone  The risks (including infection, bleeding, pain, and uterine perforation) and benefits of the procedure were explained to the patient and Written informed consent was obtained.  The patient was placed in the dorsal lithotomy position.  Bimanual exam showed the uterus to be in the neutral position.  A Graves' speculum inserted in the vagina, and the cervix was visualized. The cervix was then prepped with povidone iodine, and a sharp tenaculum was applied to the anterior lip of the cervix for stabilization.  A pipelle was inserted into the uterine cavity and sounded the uterus to a depth of 8cm.  One first pass, clear, mucus like d/c seen and on second and third pass with very scanty amount of dark blood drops, tissue, mucus was collected. The sample was sent for pathologic examination.  Condition: Stable  Complications: None  Plan: The patient was advised to call for any fever or for prolonged or severe pain or bleeding. She was advised to use OTC analgesics as needed for mild to moderate pain. She was advised to avoid vaginal intercourse for 48 hours or until the bleeding has completely stopped.  Bebe Izell Raddle MD Attending Center for [employer] [facility])

## 2024-06-04 ENCOUNTER — Inpatient Hospital Stay: Payer: Self-pay | Attending: Obstetrics and Gynecology | Admitting: *Deleted

## 2024-06-04 ENCOUNTER — Other Ambulatory Visit: Payer: Self-pay

## 2024-06-04 ENCOUNTER — Encounter: Payer: Self-pay | Admitting: Obstetrics and Gynecology

## 2024-06-04 VITALS — BP 146/90 | Ht 61.0 in | Wt 148.9 lb

## 2024-06-04 DIAGNOSIS — Z0000 Encounter for general adult medical examination without abnormal findings: Secondary | ICD-10-CM

## 2024-06-04 HISTORY — PX: ENDOMETRIAL BIOPSY: PRO73

## 2024-06-04 NOTE — Addendum Note (Signed)
 Addended by: LOGAN [name] BRAVO on: 06/04/2024 08:53 AM   Modules accepted: Orders

## 2024-06-04 NOTE — Progress Notes (Signed)
 [facility] Re-screening   Interpreter- Bernice Angry, [place]   Clinical Measurement:  Vitals:   06/04/24 0832 06/04/24 0846  BP: (!) 141/69 (!) 146/90   Fasting Labs Drawn Today, will review with patient when they result.   Medical History: Patient states that she has high cholesterol, does not have high blood pressure and she does not have diabetes. Patient states that she does not have history of gestational hypertension, does not have history of pre-eclampsia/eclampsia and she does not have history of gestational diabetes.    Medications: Patient states that she does take medication to lower cholesterol, blood pressure or blood sugar.  Patient does not take an aspirin a day to help prevent a heart attack or stroke. During the past 7 days patient has taken prescribed medication to lower cholesterol on 7 days.   Blood pressure, self measurement: Patient states that she does measure blood pressure from home. She checks her blood pressure a few times per week. She shares her readings with a health care provider: yes.   Nutrition: Patient states that on average she eats 2 cups of fruit and 1 cups of vegetables per day. Patient states that she does eat fish at least 2 times per week. Patient eats more than half servings of whole grains. Patient drinks less than 36 ounces of beverages with added sugar weekly: yes. Patient is currently watching sodium or salt intake: yes. In the past 7 days patient has consumed drinks containing alcohol on 0 days. On a day that patient consumes drinks containing alcohol on average 0 drinks are consumed.      Physical activity: Patient states that she gets 0 minutes of physical activity each week.  Smoking status: Patient states that she has has never smoked .   Quality of life: Over the past 2 weeks patient states that she had little interest or pleasure in doing things: not at all. She has been feeling down, depressed or hopeless:not at all.   Social Determinants  of Health Assessment:   Computer Use: During the last 12 months patient states that she has used any of the following: desktop/laptop, smart phone or tablet/other portable wireless computer: yes.   Internet Use: During the last 12 months, did you or any member of your household have access to the internet: Yes, by paying a cell phone company or internet service provider.   Food Insecurities: During the last 12 months, where there any times when you were worried that you would run out of food because of a lack of money or other resources: No.   Transportation Barriers: During the last 12 months, have you missed a doctor's appointment because of transportation problems: No.   Childcare Barriers: If you are currently using childcare services, please identify  the type of services you use. (If not using childcare services, please select Not applicable): not applicable. During the last 12 months, have you had any barriers to childcare services such as: not applicable.   Housing: What is your housing situation today: I have housing.   Intimate Partner Violence: During the last 12 months, how often did your partner physically hurt you: never. During the last 12 months, how often did your partner insult you or talk down to you: never.  Medication Adherence: During the last 12 months, did you ever forget to take your medicine: No. During the last 12 months, were you careless ar times about taking your medicine: No. During the last 12 months, when you felt better did you sometimes  stop taking your medication: No. During the last 12 months, sometimes if you felt worse when you took your medicine did you stop taking it: No.   Risk reduction and counseling:   Health Coaching: Spoke with the patient about the daily recommendations for fruits and vegetables. Showed patient what a serving size would look like. Patient has been consuming fish at least twice per week. Patient has been consuming whole wheat  bread, oatmeal, whole grain cereals (sometimes) and whole wheat pasta (sometimes). Patient has not been walking recently but wants to start back. Patient has set an exercise based goal, see below.  Goal: Patient will start exercising (walking) 2-3 times per week for at least 20 minutes. Patient will work on reaching this goal over the next 2 months.   Navigation:  I will notify patient of lab results.  Patient is aware of 2 more health coaching sessions and a follow up.

## 2024-06-04 NOTE — Addendum Note (Signed)
 Addended by: LOGAN [name] BRAVO on: 06/04/2024 09:11 AM   Modules accepted: Orders

## 2024-06-05 LAB — LIPID PANEL
Chol/HDL Ratio: 5.7 ratio — ABNORMAL HIGH (ref 0.0–4.4)
Cholesterol, Total: 261 mg/dL — ABNORMAL HIGH (ref 100–199)
HDL: 46 mg/dL (ref >39)
LDL Chol Calc (NIH): 171 mg/dL — ABNORMAL HIGH (ref 0–99)
Triglycerides: 235 mg/dL — ABNORMAL HIGH (ref 0–149)
VLDL Cholesterol Cal: 44 mg/dL — ABNORMAL HIGH (ref 5–40)

## 2024-06-05 LAB — HEMOGLOBIN A1C
Est. average glucose Bld gHb Est-mCnc: 137 mg/dL
Hgb A1c MFr Bld: 6.4 % — ABNORMAL HIGH (ref 4.8–5.6)

## 2024-06-05 LAB — GLUCOSE, RANDOM: Glucose: 106 mg/dL — ABNORMAL HIGH (ref 70–99)

## 2024-06-11 ENCOUNTER — Ambulatory Visit: Payer: Self-pay

## 2024-06-11 NOTE — Telephone Encounter (Signed)
 Health coaching 2   interpreter-Erika Jayne ROUGE   Labs-261 cholesterol, 171 LDL cholesterol, 46 HDL cholesterol, 235 triglycerides, 6.4 hemoglobin A1C, 106 mean plasma glucose. Patient understands and is aware of her lab results.   Goals-  1. Reduce the amount of fried and fatty foods consumed. Try to grill, bake, broil or sautee foods instead. 2. Increase lean protein intake like chicken or fish. Reduce red meat intake. 3. Increase whole grains. 4. Reduce the amount of sweet and sugary foods and drinks consumed. 5. Reduce the amount of carb rich foods consumed. 6. Daily exercise of 20-30 minutes. Current goal of 3-5 days per week.   Navigation:  Patient is aware of 1 more health coaching sessions and a follow up. Referred patient to Internal Medicine for FU for elevated labs. Will call patient with FU appointment information once scheduled.

## 2024-06-17 ENCOUNTER — Encounter: Payer: Self-pay | Admitting: Obstetrics and Gynecology

## 2024-06-19 ENCOUNTER — Encounter: Payer: Self-pay | Admitting: Student

## 2024-06-19 ENCOUNTER — Other Ambulatory Visit: Payer: Self-pay

## 2024-06-19 ENCOUNTER — Ambulatory Visit: Payer: Self-pay | Admitting: Student

## 2024-06-19 VITALS — BP 133/77 | HR 64 | Temp 97.9°F | Ht 61.0 in | Wt 158.2 lb

## 2024-06-19 DIAGNOSIS — Z1159 Encounter for screening for other viral diseases: Secondary | ICD-10-CM

## 2024-06-19 DIAGNOSIS — E781 Pure hyperglyceridemia: Secondary | ICD-10-CM

## 2024-06-19 DIAGNOSIS — R7303 Prediabetes: Secondary | ICD-10-CM

## 2024-06-19 DIAGNOSIS — Z23 Encounter for immunization: Secondary | ICD-10-CM

## 2024-06-19 DIAGNOSIS — Z139 Encounter for screening, unspecified: Secondary | ICD-10-CM

## 2024-06-19 DIAGNOSIS — K760 Fatty (change of) liver, not elsewhere classified: Secondary | ICD-10-CM | POA: Insufficient documentation

## 2024-06-19 DIAGNOSIS — Z114 Encounter for screening for human immunodeficiency virus [HIV]: Secondary | ICD-10-CM

## 2024-06-19 DIAGNOSIS — Z0000 Encounter for general adult medical examination without abnormal findings: Secondary | ICD-10-CM

## 2024-06-19 DIAGNOSIS — E785 Hyperlipidemia, unspecified: Secondary | ICD-10-CM

## 2024-06-19 MED ORDER — ATORVASTATIN CALCIUM 10 MG PO TABS: 10.0000 mg | ORAL_TABLET | Freq: Every day | ORAL | 3 refills | Status: DC

## 2024-06-19 MED ORDER — FENOFIBRATE 48 MG PO TABS: 48.0000 mg | ORAL_TABLET | Freq: Every day | ORAL | 3 refills | Status: AC

## 2024-06-19 NOTE — Progress Notes (Signed)
 Internal Medicine Clinic Attending  Case discussed with the resident at the time of the visit.  We reviewed the resident's history and exam and pertinent patient test results.  I agree with the assessment, diagnosis, and plan of care documented in the resident's note.

## 2024-06-19 NOTE — Assessment & Plan Note (Signed)
 Noted on prior MR abdomen. Normal LFTs on last CMP. Denies acute abdominal symptoms. Hx of prediabetes of A1c 6.4%. Counseled on lifestyle modifications. Consideration of GLP-1 in future.

## 2024-06-19 NOTE — Assessment & Plan Note (Deleted)
 [name]

## 2024-06-19 NOTE — Progress Notes (Signed)
 CC: Recent labs from [facility]  HPI: [name] is a 53 y.o. female living with a history stated below and presents today for recent labs. Please see problem based assessment and plan for additional details.  Discussed the use of AI scribe software for clinical note transcription with the patient, who gave verbal consent to proceed.  History of Present Illness   Kathryn Cannon is a 53 year old female with hyperlipidemia and prediabetes who presents for follow-up on her cholesterol levels.  She has elevated cholesterol levels despite taking fenofibrate  daily. She is not on any other prescription medications but occasionally takes Tylenol  for headaches and magnesium.  She has a history of prediabetes, with a recent A1c of 6.4. She is not currently on medication for this condition and is focusing on lifestyle changes such as diet and exercise. She has not been exercising for the past two weeks due to cold weather but plans to resume. She is considering dietary changes to manage her prediabetes, including reducing sugar, sweets, and soda intake. She consumes two tortillas a day and does not eat much pasta or bread.  She last received a tetanus shot 20 years ago during her pregnancy.      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 (Patient not  taking: Reported on 06/04/2024)     Magnesium Oxide 500 MG CAPS Take 500 mg by mouth daily as needed. (Patient not taking: Reported on 06/04/2024)     No current facility-administered medications on file prior to visit.    Family History  Problem Relation Age of Onset   Diabetes Paternal Aunt    Breast cancer Neg Hx    Esophageal cancer Neg Hx    Stomach cancer Neg Hx    Rectal cancer Neg Hx    Colon cancer Neg Hx     Social History   Socioeconomic History   Marital status: Married    Spouse name: Not on file   Number of children: 3   Years of education: Not on file   Highest education level: 7th grade  Occupational History   Not on file  Tobacco Use   Smoking status: Never   Smokeless tobacco: Never  Vaping Use   Vaping status: Never Used  Substance and Sexual Activity   Alcohol use: No   Drug use: No   Sexual activity: Yes    Birth control/protection: Condom  Other Topics Concern   Not on file  Social History Narrative   Not on file   Social Drivers of Health   Financial Resource Strain: Not on File (08/11/2022)   Received from [employer]    Financial Resource Strain: 0  Food Insecurity: No Food Insecurity (03/09/2024)   Hunger Vital Sign  Worried About Programme Researcher, Broadcasting/film/video in the Last Year: Never true    Ran Out of Food in the Last Year: Never true  Transportation Needs: No Transportation Needs (03/09/2024)   PRAPARE - Administrator, Civil Service (Medical): No    Lack of Transportation (Non-Medical): No  Physical Activity: Sufficiently Active (06/19/2024)   Exercise Vital Sign    Days of Exercise per Week: 3 days    Minutes of Exercise per Session: 60 min  Stress: No Stress Concern Present (06/19/2024)   [employer] of Occupational Health - Occupational Stress Questionnaire    Feeling of Stress: Not at all  Social Connections: Moderately Integrated (06/19/2024)   Social Connection and Isolation Panel    Frequency  of Communication with Friends and Family: More than three times a week    Frequency of Social Gatherings with Friends and Family: Once a week    Attends Religious Services: More than 4 times per year    Active Member of [employer] or Organizations: No    Attends Banker Meetings: Never    Marital Status: Married  Catering Manager Violence: Not At Risk (12/20/2023)   Humiliation, Afraid, Rape, and Kick questionnaire    Fear of Current or Ex-Partner: No    Emotionally Abused: No    Physically Abused: No    Sexually Abused: No    Review of Systems: ROS negative except for what is noted on the assessment and plan.  Vitals:   06/19/24 1000  BP: 133/77  Pulse: 64  Temp: 97.9 F (36.6 C)  TempSrc: Oral  SpO2: 96%  Weight: 158 lb 3.2 oz (71.8 kg)  Height: 5' 1 (1.549 m)   Physical Exam: Constitutional: well-appearing female sitting in chair comfortably, in no acute distress Cardiovascular: regular rate and rhythm Pulmonary/Chest: normal work of breathing on room air, lungs clear to auscultation bilaterally Neurological: alert & oriented x 3 Skin: warm and dry  Assessment & Plan:   Assessment & Plan Hyperlipidemia, unspecified hyperlipidemia type Hypertriglyceridemia Last lipid panel last month with triglycerides 235 (fasting). Taking fenofibrate  48 mg daily, reports adherence. LDL 171. Her PREVENT score (w/ A1c): 4.21% 10-year CVD risk and 24.23% 30-year risk. Shared-decision making, patient agreeable for statin therapy.   Plan -Continue fenofibrate  -Start atorvastatin 10 mg  -Repeat lipid panel in 2-3 months   Orders:   fenofibrate  (TRICOR ) 48 MG tablet; Take 1 tablet (48 mg total) by mouth daily.   atorvastatin (LIPITOR) 10 MG tablet; Take 1 tablet (10 mg total) by mouth daily.  Metabolic dysfunction-associated steatotic liver disease (MASLD) Noted on prior MR abdomen. Normal LFTs on last CMP. Denies acute abdominal symptoms. Hx of prediabetes of A1c 6.4%.  Counseled on lifestyle modifications. Consideration of GLP-1 in future.     Pre-diabetes Last A1c 6.4 in October. Hx of MASLD. Discussed medications but patient declined at this time. Opted for lifestyle modifications. Recheck A1c in 3-6 months. Consideration of GLP-1.     Healthcare maintenance Encounter for HCV screening test for low risk patient Screening for HIV (human immunodeficiency virus) Encounter for immunization Orders:   Tdap vaccine greater than or equal to 7yo IM   Hepatitis C Ab reflex to Quant PCR   HIV antibody (with reflex)  Encounter for screening involving social determinants of health (SDoH) Has active/renewed [facility] card for financial assistance. Scanned in today.        Return in about 3 months (around 09/19/2024) for cholesterol check.   Patient discussed with  Dr. Machen  Rosalinda Seaman, D.O. [facility] Internal Medicine, PGY-3 Clinic Phone: [phone] Date 06/19/2024 Time 12:17 PM

## 2024-06-19 NOTE — Assessment & Plan Note (Deleted)
  ASCVD***  PREVENT***   Orders:   atorvastatin (LIPITOR) 10 MG tablet; Take 1 tablet (10 mg total) by mouth daily.

## 2024-06-19 NOTE — Assessment & Plan Note (Addendum)
 Last A1c 6.4 in October. Hx of MASLD. Discussed medications but patient declined at this time. Opted for lifestyle modifications. Recheck A1c in 3-6 months. Consideration of GLP-1.

## 2024-06-19 NOTE — Assessment & Plan Note (Signed)
 Last lipid panel last month with triglycerides 235 (fasting). Taking fenofibrate  48 mg daily, reports adherence. LDL 171. Her PREVENT score (w/ A1c): 4.21% 10-year CVD risk and 24.23% 30-year risk. Shared-decision making, patient agreeable for statin therapy.   Plan -Continue fenofibrate  -Start atorvastatin 10 mg  -Repeat lipid panel in 2-3 months   Orders:   fenofibrate  (TRICOR ) 48 MG tablet; Take 1 tablet (48 mg total) by mouth daily.   atorvastatin (LIPITOR) 10 MG tablet; Take 1 tablet (10 mg total) by mouth daily.

## 2024-06-19 NOTE — Patient Instructions (Addendum)
 Gracias, Sra. Kathryn Cannon, por permitirnos atenderla hoy. Hoy hablamos sobre:  -Renovacin de la receta de fenofibrato. Contine tomndolo.  -Inicio de atorvastatina 10 mg una vez al da para el colesterol.  -Recibi la vacuna Tdap hoy.  -Anlisis de sangre hoy. La llamar con los [name].  Seguimiento: 3-4 meses  ------------  Thank you, [name] for allowing us  to provide your care today. Today we discussed:  -Refilled fenofibrate . Continue to take.  -START atorvastatin 10  mg once a day for cholesterol -Received Tdap today.  -Blood work today, I will call with results.    I have ordered the following medication/changed the following medications:  Start the following medications: Meds ordered this encounter  Medications   fenofibrate  (TRICOR ) 48 MG tablet    Sig: Take 1 tablet (48 mg total) by mouth daily.    Dispense:  90 tablet    Refill:  3   atorvastatin (LIPITOR) 10 MG tablet    Sig: Take 1 tablet (10 mg total) by mouth daily.    Dispense:  90 tablet    Refill:  3     Follow up: 3-4 months   Should you have any questions or concerns please call the internal medicine clinic at [phone].    Ediel Unangst, D.O. [facility]

## 2024-06-19 NOTE — Assessment & Plan Note (Deleted)
 Kathryn Cannon

## 2024-06-20 ENCOUNTER — Ambulatory Visit: Payer: Self-pay | Admitting: Obstetrics and Gynecology

## 2024-06-20 ENCOUNTER — Ambulatory Visit: Payer: Self-pay | Admitting: Student

## 2024-06-20 VITALS — BP 138/74 | HR 51 | Wt 153.2 lb

## 2024-06-20 DIAGNOSIS — Z8742 Personal history of other diseases of the female genital tract: Secondary | ICD-10-CM

## 2024-06-20 DIAGNOSIS — Z758 Other problems related to medical facilities and other health care: Secondary | ICD-10-CM

## 2024-06-20 DIAGNOSIS — Z9229 Personal history of other drug therapy: Secondary | ICD-10-CM

## 2024-06-20 DIAGNOSIS — Z603 Acculturation difficulty: Secondary | ICD-10-CM

## 2024-06-20 LAB — HCV INTERPRETATION

## 2024-06-20 LAB — HCV AB W REFLEX TO QUANT PCR: HCV Ab: NONREACTIVE

## 2024-06-20 LAB — HIV ANTIBODY (ROUTINE TESTING W REFLEX): HIV Screen 4th Generation wRfx: NONREACTIVE

## 2024-06-20 NOTE — Progress Notes (Signed)
 Internal Medicine Clinic Attending  Case discussed with the resident at the time of the visit.  We reviewed the resident's history and exam and pertinent patient test results.  I agree with the assessment, diagnosis, and plan of care documented in the resident's note.

## 2024-06-20 NOTE — Progress Notes (Signed)
 Obstetrics and Gynecology Visit Return Patient Evaluation  Appointment Date: 06/20/2024  Primary Care Provider: Nooruddin, Saad  OBGYN Clinic: [facility]  Chief Complaint: follow up embx results  History of Present Illness:  Kathryn Cannon is a 53 y.o. seen by me on 10/14 and embx came back benign, inactive strips of atrophic endometrium.   Interval History: Since that time, she states that she hasn't had any s/s since the.   Review of Systems:  as noted in the History of Present Illness.  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 Current Outpatient Medications  Medication Sig Dispense Refill   atorvastatin (LIPITOR) 10 MG tablet Take 1 tablet (10 mg total) by mouth daily. 90 tablet 3   fenofibrate  (TRICOR ) 48 MG tablet Take 1 tablet (48 mg total) by mouth daily. 90 tablet 3   acetaminophen  (TYLENOL ) 500 MG tablet Take 1,000 mg by mouth every 8 (eight) hours as needed for headache. (Patient not taking: Reported on 06/20/2024)     Magnesium Oxide 500 MG CAPS Take 500 mg by mouth daily as needed. (Patient not taking: Reported on 06/20/2024)     No current facility-administered medications for this visit.    Allergies: has no known allergies.  Physical Exam:  BP 138/74   Pulse (!) 51   Wt 153 lb 3.2 oz (69.5 kg)   LMP 02/20/2024 (Exact Date)   BMI 28.95 kg/m  Body  mass index is 28.95 kg/m. General appearance: Well nourished, well developed female in no acute distress.  Neuro/Psych:  Normal mood and affect.    Assessment: patient doing well  Plan:  1. History of tamoxifen  therapy (Primary)  2. History of postmenopausal bleeding I told her I recommend expectant management, since I got a great sample with her embx and the benign, inactive findings. I told her that if she has repeat PMB that I recommend a hysterectomy at that point. I told her that she could do a mirena now, but I don't feel that she has a continuing risk factors that can cause hyperplasia, such as obesity.   Patient is amenable to plan  In person interpreter used.    RTC: 1 year and PRN  Future Appointments  Date Time Provider Department Center  06/21/2024 11:20 AM [facility] 1 [facility]    Bebe Izell Raddle MD Attending [facility])

## 2024-06-21 ENCOUNTER — Ambulatory Visit
Admission: RE | Admit: 2024-06-21 | Discharge: 2024-06-21 | Disposition: A | Payer: Self-pay | Source: Ambulatory Visit | Attending: Obstetrics and Gynecology | Admitting: Obstetrics and Gynecology

## 2024-06-21 ENCOUNTER — Ambulatory Visit: Payer: Self-pay

## 2024-07-02 ENCOUNTER — Telehealth: Payer: Self-pay

## 2024-07-02 NOTE — Telephone Encounter (Signed)
 Pt called to inform Dr. Izell that she began to have heavy vaginal bleeding as of yesterday.  She states she had a EMB with Dr. Izell who told her that if she starts bleeding again he may recommend hysterectomy surgery.  I advised pt that I will inform provider of her bleeding so he can make a recommendation and someone can contact her with his advice.   This  conversation was interpreted by [facility] [phone].   [place], RN

## 2024-07-25 ENCOUNTER — Other Ambulatory Visit: Payer: Self-pay

## 2024-07-25 ENCOUNTER — Ambulatory Visit: Payer: Self-pay | Admitting: Obstetrics and Gynecology

## 2024-07-25 VITALS — BP 130/85 | HR 76 | Wt 150.4 lb

## 2024-07-25 DIAGNOSIS — Z9229 Personal history of other drug therapy: Secondary | ICD-10-CM

## 2024-07-25 DIAGNOSIS — Z758 Other problems related to medical facilities and other health care: Secondary | ICD-10-CM

## 2024-07-25 DIAGNOSIS — Z8742 Personal history of other diseases of the female genital tract: Secondary | ICD-10-CM

## 2024-07-25 DIAGNOSIS — Z603 Acculturation difficulty: Secondary | ICD-10-CM

## 2024-07-26 NOTE — Progress Notes (Unsigned)
 Obstetrics and Gynecology Established Patient Evaluation  Appointment Date: 07/25/2024  OBGYN Clinic: [facility]  Primary Care Provider: Nooruddin, Saad  Referring Provider: Nooruddin, Saad, MD  Chief Complaint: persistent post menopausal bleeding  History of Present Illness: Kathryn Cannon is a 53 y.o. Hispanic [phone] (Patient's last menstrual period was 07/06/2024.), seen for the above chief complaint. 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    Patient had initial hysteroscopy due to in office November 2024 embx showing scanty, inactive endometrium and abundant blood and concern for inadequate sampling; pap and hpv at that time negative and FSH 72 with undetectable e2 level c/w menopause. January 2025 hysteroscopy showed disordered, proliferative endometrium with benign endometrial polyp but no malignancy. Plan at that time was expectant management but she had another week of bleeding in July and Mirena considered. 10/14 repeat in office endometrial biopsy showed benign, inactive endometrium and no malignancy. Given no more bleeding, plan to proceed with expectant management and no Mirena since she didn't have any risk factors for hyperplasia, such as BMI, continued tamoxifen  usage, etc.   Interval History: after her last visit on 11/5, she states she had a week of heavy bleeding in late November that lasted a week.   Review of Systems: Pertinent items are noted in HPI.   Patient Active Problem List   Diagnosis Date Noted   Metabolic dysfunction-associated steatotic liver disease (MASLD) 06/19/2024   Hemangioma of liver 12/20/2023   Numbness and tingling in right hand 12/20/2023   Polyp of endometrium  10/12/2023   History of postmenopausal bleeding 08/23/2023   History of tamoxifen  therapy 08/23/2023   Language barrier 07/06/2023   Pre-diabetes 03/27/2020   Hyperlipidemia 03/22/2019   Elevated blood pressure reading 03/22/2019   Atypical ductal hyperplasia of left breast 06/13/2017   Past Medical History:  Past Medical History:  Diagnosis Date   Abnormal uterine bleeding (AUB)    Fatty liver    Headache    History of atypical hyperplasia of breast 03/2017   oncology--- dr lorriane  lindsey causey np;   dx 08/ 2018  s/p left breast lumpectomy ,  left breast atypical ductal hyperplasia,  started tamoxifen  10/ 2018 and completed 5 yrs 10/ 2023 releasese by oncology   History of tamoxifen  therapy    started 10/ 2018  completed 10/ 2023   Hyperlipidemia    Pre-diabetes    Past Surgical History:  Past Surgical History:  Procedure Laterality Date   BREAST LUMPECTOMY WITH RADIOACTIVE SEED LOCALIZATION Left 04/27/2017   Procedure: RADIOCATIVE SEED GUIDED LEFT BREAST LUMPECTOMY, ERAS PATHWAY;  Surgeon: Curvin Deward MOULD, MD;  Location: [facility] OR;  Service: General;  Laterality: Left;   CESAREAN SECTION     3 previous   COLONOSCOPY     ENDOMETRIAL BIOPSY  06/04/2024   HYSTEROSCOPY WITH D & C N/A 09/07/2023   Procedure: DILATATION AND CURETTAGE /HYSTEROSCOPY;  Surgeon: Izell Harari, MD;  Location: [facility];  Service: Gynecology;  Laterality: N/A;   Past Obstetrical History:  OB History  Gravida Para Term Preterm AB Living  5 3 3  2 3   SAB IAB Ectopic Multiple Live Births  2    3    # Outcome Date GA Lbr Len/2nd Weight Sex Type Anes PTL Lv  5 SAB  4 SAB           3 Term           2 Term           1 Term            Past Gynecological History: As per HPI.  Social History:  Social History   Socioeconomic History   Marital status: Married    Spouse name: Not on file   Number of children: 3   Years of education: Not on file   Highest education  level: 7th grade  Occupational History   Not on file  Tobacco Use   Smoking status: Never   Smokeless tobacco: Never  Vaping Use   Vaping status: Never Used  Substance and Sexual Activity   Alcohol use: No   Drug use: No   Sexual activity: Yes    Birth control/protection: Condom  Other Topics Concern   Not on file  Social History Narrative   Not on file   Social Drivers of Health   Tobacco Use: Low Risk (06/19/2024)   Patient History    Smoking Tobacco Use: Never    Smokeless Tobacco Use: Never    Passive Exposure: Not on file  Financial Resource Strain: Not on File (08/11/2022)   Received from [employer]    Financial Resource Strain: 0  Food Insecurity: No Food Insecurity (07/25/2024)   Epic    Worried About Programme Researcher, Broadcasting/film/video in the Last Year: Never true    Ran Out of Food in the Last Year: Never true  Transportation Needs: No Transportation Needs (07/25/2024)   Epic    Lack of Transportation (Medical): No    Lack of Transportation (Non-Medical): No  Physical Activity: Sufficiently Active (06/19/2024)   Exercise Vital Sign    Days of Exercise per Week: 3 days    Minutes of Exercise per Session: 60 min  Stress: No Stress Concern Present (06/19/2024)   [employer] of Occupational Health - Occupational Stress Questionnaire    Feeling of Stress: Not at all  Social Connections: Moderately Integrated (06/19/2024)   Social Connection and Isolation Panel    Frequency of Communication with Friends and Family: More than three times a week    Frequency of Social Gatherings with Friends and Family: Once a week    Attends Religious Services: More than 4 times per year    Active Member of [employer] or Organizations: No    Attends Banker Meetings: Never    Marital Status: Married  Catering Manager Violence: Not At Risk (12/20/2023)   Humiliation, Afraid, Rape, and Kick questionnaire    Fear of Current or Ex-Partner: No    Emotionally Abused:  No    Physically Abused: No    Sexually Abused: No  Depression (PHQ2-9): Low Risk (07/25/2024)   Depression (PHQ2-9)    PHQ-2 Score: 0  Alcohol Screen: Low Risk (06/19/2024)   Alcohol Screen    Last Alcohol Screening Score (AUDIT): 0  Housing: Unknown (12/20/2023)   Housing Stability Vital Sign    Unable to Pay for Housing in the Last Year: No    Number of Times Moved in the Last Year: Not on file    Homeless in the Last Year: No  Utilities: Not At Risk (12/20/2023)   AHC Utilities    Threatened with loss of utilities: No  Health Literacy: Adequate Health Literacy (06/19/2024)   B1300 Health Literacy    Frequency  of need for help with medical instructions: Never   Family History:  Family History  Problem Relation Age of Onset   Diabetes Paternal Aunt    Breast cancer Neg Hx    Esophageal cancer Neg Hx    Stomach cancer Neg Hx    Rectal cancer Neg Hx    Colon cancer Neg Hx    Medications Merrell Cannon had no medications administered during this visit. Current Outpatient Medications  Medication Sig Dispense Refill   acetaminophen  (TYLENOL ) 500 MG tablet Take 1,000 mg by mouth every 8 (eight) hours as needed for headache.     atorvastatin  (LIPITOR) 10 MG tablet Take 1 tablet (10 mg total) by mouth daily. 90 tablet 3   fenofibrate  (TRICOR ) 48 MG tablet Take 1 tablet (48 mg total) by mouth daily. 90 tablet 3   Magnesium Oxide 500 MG CAPS Take 500 mg by mouth daily as needed.     No current facility-administered medications for this visit.   Allergies Patient has no known allergies.  Physical Exam:  BP 130/85   Pulse 76   Wt 150 lb 6.4 oz (68.2 kg)   LMP 06/24/2024   BMI 28.42 kg/m  Body mass index is 28.42 kg/m.  General appearance: Well nourished, well developed female in no acute distress.  Respiratory:  Normal respiratory effort Abdomen: positive bowel sounds and no masses, hernias; diffusely non tender to palpation, non distended Neuro/Psych:  Normal mood  and affect.  Skin:  Warm and dry.   Laboratory: none  Radiology: none  Assessment: patient stable  Plan:  1. History of postmenopausal bleeding (Primary) Long d/w her re: next steps. I told her that I recommend hysterectomy given persistent bleeding and risk of occult malignancy. I d/w her re: the risks of surgery were discussed in detail with the patient including but not limited to: bleeding which may require transfusion or reoperation; infection which may require prolonged hospitalization or re-hospitalization and antibiotic therapy; injury to bowel, bladder, ureters and major vessels or other surrounding organs which may lead to other procedures; formation of adhesions; need for additional procedures including laparotomy or subsequent procedures secondary to intraoperative injury or abnormal pathology; thromboembolic phenomenon; incisional problems and other postoperative or anesthesia complications. The postoperative expectations were also discussed in detail.  I also told her that I don't recommend but can also do another hysteroscopy, d&c, but risk of occult disease and recurrence  Given her age and no evidence of need for BSO from oncology standpoint, I recommend leaving her ovaries.   Patient is amenable to proceeding with TLH/BS/cysto. Request sent to scheduler.   In person interpreter used   Bebe Furry, Mickey MD Attending Center for [facility])

## 2024-08-23 ENCOUNTER — Telehealth: Payer: Self-pay

## 2024-08-23 NOTE — Telephone Encounter (Signed)
 I called patient to see if she's available for surgery w/ Dr. Izell on 09/17/24 using interpreter services(ID#[id number],[employer]) Patient agreed to surgery details. Pre-Op instructions were provided. Patient states she is using financial assistance to cover the procedure.

## 2024-09-09 ENCOUNTER — Other Ambulatory Visit: Payer: Self-pay | Admitting: Obstetrics and Gynecology

## 2024-09-09 DIAGNOSIS — Z01818 Encounter for other preprocedural examination: Secondary | ICD-10-CM

## 2024-09-12 NOTE — Progress Notes (Signed)
 Surgical Instructions   Your procedure is scheduled on Monday, February 2nd, 2026. Report to [facility] Main Entrance A at 8:45 A.M., then check in with the Admitting office. Any questions or running late day of surgery: call [phone]  Questions prior to your surgery date: call [phone], Monday-Friday, 8am-4pm. If you experience any cold or flu symptoms such as cough, fever, chills, shortness of breath, etc. between now and your scheduled surgery, please notify us  at the above number.     Remember:  Do not eat after midnight the night before your surgery   You may drink clear liquids until 7:45 the morning of your surgery.   Clear liquids allowed are: Water, Non-Citrus Juices (without pulp), Carbonated Beverages, Clear Tea (no milk, honey, etc.), Black Coffee Only (NO MILK, CREAM OR POWDERED CREAMER of any kind), and Gatorade.    Take these medicines the morning of surgery with A SIP OF WATER: Atorvastatin  (Lipitor) Fenofibrate  (Tricor )   May take these medicines IF NEEDED: Acetaminophen  (Tylenol )    One week prior to surgery, STOP taking any Aspirin (unless otherwise instructed by your surgeon) Aleve, Naproxen, Ibuprofen , Motrin , Advil , Goody's, BC's, all herbal medications, fish oil, and non-prescription vitamins.                     Do NOT Smoke (Tobacco/Vaping) for 24 hours prior to your procedure.  If you use a CPAP at night, you may bring your mask/headgear for your overnight stay.   You will be asked to remove any contacts, glasses, piercing's, hearing aid's, dentures/partials prior to surgery. Please bring cases for these items if needed.    Your surgeon will determine if you are to be admitted or discharged the same day.  Patients discharged the day of surgery will not be allowed to drive home, and someone needs to stay with them for 24 hours.  SURGICAL WAITING ROOM VISITATION Patients may have no more than 2 support people in the waiting area - these  visitors may rotate.   Pre-op nurse will coordinate an appropriate time for 2 ADULT support persons, who may not rotate, to accompany patient in pre-op.  Children under the age of 21 must have an adult with them who is not the patient and must remain in the main waiting area with an adult.  If the patient needs to stay at the hospital during part of their recovery, the visitor guidelines for inpatient rooms apply.  Please refer to the [facility] website for the visitor guidelines for any additional information.   If you received a COVID test during your pre-op visit  it is requested that you wear a mask when out in public, stay away from anyone that may not be feeling well and notify your surgeon if you develop symptoms. If you have been in contact with anyone that has tested positive in the last 10 days please notify you surgeon.      Pre-operative CHG Bathing Instructions   You can play a key role in reducing the risk of infection after surgery. Your skin needs to be as free of germs as possible. You can reduce the number of germs on your skin by washing with CHG (chlorhexidine  gluconate) soap before surgery. CHG is an antiseptic soap that kills germs and continues to kill germs even after washing.   DO NOT use if you have an allergy to chlorhexidine /CHG or antibacterial soaps. If your skin becomes reddened or irritated, stop using the CHG and notify one  of our RNs at [phone].              TAKE A SHOWER THE NIGHT BEFORE SURGERY   Please keep in mind the following:  DO NOT shave, including legs and underarms, 48 hours prior to surgery.   You may shave your face before/day of surgery.  Place clean sheets on your bed the night before surgery Use a clean washcloth (not used since being washed) for shower. DO NOT sleep with pet's night before surgery.  CHG Shower Instructions:  Wash your face and private area with normal soap. If you choose to wash your hair, wash first with your  normal shampoo.  After you use shampoo/soap, rinse your hair and body thoroughly to remove shampoo/soap residue.  Turn the water OFF and apply half the bottle of CHG soap to a CLEAN washcloth.  Apply CHG soap ONLY FROM YOUR NECK DOWN TO YOUR TOES (washing for 3-5 minutes)  DO NOT use CHG soap on face, private areas, open wounds, or sores.  Pay special attention to the area where your surgery is being performed.  If you are having back surgery, having someone wash your back for you may be helpful. Wait 2 minutes after CHG soap is applied, then you may rinse off the CHG soap.  Pat dry with a clean towel  Put on clean pajamas    Additional instructions for the day of surgery: If you choose, you may shower the morning of surgery with an antibacterial soap.  DO NOT APPLY any lotions, deodorants, cologne, or perfumes.   Do not wear jewelry or makeup Do not wear nail polish, gel polish, artificial nails, or any other type of covering on natural nails (fingers and toes) Do not bring valuables to the hospital. [facility] is not responsible for valuables/personal belongings. Put on clean/comfortable clothes.  Please brush your teeth.  Ask your nurse before applying any prescription medications to the skin.

## 2024-09-13 ENCOUNTER — Encounter (HOSPITAL_COMMUNITY)
Admission: RE | Admit: 2024-09-13 | Discharge: 2024-09-13 | Disposition: A | Discharge status: Home / Self Care | Payer: Self-pay | Source: Ambulatory Visit | Attending: Obstetrics and Gynecology | Admitting: Obstetrics and Gynecology

## 2024-09-13 ENCOUNTER — Ambulatory Visit: Payer: Self-pay

## 2024-09-13 ENCOUNTER — Other Ambulatory Visit: Payer: Self-pay

## 2024-09-13 ENCOUNTER — Encounter (HOSPITAL_COMMUNITY): Payer: Self-pay

## 2024-09-13 VITALS — BP 138/84 | HR 55 | Temp 98.2°F | Ht 61.0 in | Wt 151.2 lb

## 2024-09-13 DIAGNOSIS — Z01818 Encounter for other preprocedural examination: Secondary | ICD-10-CM

## 2024-09-13 DIAGNOSIS — E785 Hyperlipidemia, unspecified: Secondary | ICD-10-CM

## 2024-09-13 DIAGNOSIS — Z01812 Encounter for preprocedural laboratory examination: Secondary | ICD-10-CM | POA: Insufficient documentation

## 2024-09-13 DIAGNOSIS — Z8742 Personal history of other diseases of the female genital tract: Secondary | ICD-10-CM

## 2024-09-13 LAB — COMPREHENSIVE METABOLIC PANEL WITH GFR
ALT: 30 U/L (ref 0–44)
AST: 28 U/L (ref 15–41)
Albumin: 4.5 g/dL (ref 3.5–5.0)
Alkaline Phosphatase: 51 U/L (ref 38–126)
Anion gap: 13 (ref 5–15)
BUN: 12 mg/dL (ref 6–20)
CO2: 25 mmol/L (ref 22–32)
Calcium: 9.6 mg/dL (ref 8.9–10.3)
Chloride: 104 mmol/L (ref 98–111)
Creatinine, Ser: 0.66 mg/dL (ref 0.44–1.00)
GFR, Estimated: >60 mL/min
Glucose, Bld: 122 mg/dL — ABNORMAL HIGH (ref 70–99)
Potassium: 3.9 mmol/L (ref 3.5–5.1)
Sodium: 143 mmol/L (ref 135–145)
Total Bilirubin: 0.4 mg/dL (ref 0.0–1.2)
Total Protein: 7.1 g/dL (ref 6.5–8.1)

## 2024-09-13 LAB — TYPE AND SCREEN
ABO/RH(D): O POS
Antibody Screen: NEGATIVE

## 2024-09-13 LAB — CBC
HCT: 39.6 % (ref 36.0–46.0)
Hemoglobin: 13.7 g/dL (ref 12.0–15.0)
MCH: 30.6 pg (ref 26.0–34.0)
MCHC: 34.6 g/dL (ref 30.0–36.0)
MCV: 88.6 fL (ref 80.0–100.0)
Platelets: 249 10*3/uL (ref 150–400)
RBC: 4.47 MIL/uL (ref 3.87–5.11)
RDW: 12.5 % (ref 11.5–15.5)
WBC: 4.7 10*3/uL (ref 4.0–10.5)
nRBC: 0 % (ref 0.0–0.2)

## 2024-09-13 NOTE — Progress Notes (Signed)
" ° °  Established Patient Office Visit  Subjective   Patient ID: Kathryn Cannon, female    DOB: [birth year]  Age: 54 y.o. MRN: [id number]  Chief Complaint  Patient presents with   Follow-up    HPI Patient is a 54 year old female with PMH MASLD, Prediabetes, Hyperlipidemia that presents today for follow up of chronic conditions. See problem based plan and assessment for more details    ROS See problem based plan and assessment for more details    Objective:     BP 138/84 (BP Location: Right Arm, Patient Position: Sitting, Cuff Size: Small)   Pulse (!) 55   Temp 98.2 F (36.8 C) (Oral)   Ht 5' 1 (1.549 m)   Wt 151 lb 3.2 oz (68.6 kg)   SpO2 100%   BMI 28.57 kg/m    Physical Exam Constitutional:      General: She is not in acute distress. Cardiovascular:     Rate and Rhythm: Normal rate and regular rhythm.     Heart sounds: No murmur heard. Pulmonary:     Effort: Pulmonary effort is normal. No respiratory distress.     Breath sounds: Normal breath sounds.  Neurological:     Mental Status: She is alert.      No results found for any visits on 09/13/24.    The 10-year ASCVD risk score (Arnett DK, et al., 2019) is: 2.7%    Assessment & Plan:   Problem List Items Addressed This Visit       Other   Hyperlipidemia - Primary (Chronic)   Relevant Orders   Lipid Profile   History of postmenopausal bleeding   Assessment & Plan Hyperlipidemia, unspecified hyperlipidemia type Patient was started on atorvastatin  10 mg at her last visit. Has continued taking fenofibrate  48 mg. Will repeat lipid panel today, patient is fasting. Prevent score at last visit was noted to be 4.21% 10 year CVD and 24.23% 30 year risk. Did explain to patient with LDL still being high will likely need to increase dose of statin. Patient denies any muscle aches. States that she has been exercising and working on diet as well  Plan: Lipid panel Continue fenofibrate  48 mg and  atorvastatin  10 mg for now( will likely increase this dose pending results)  History of postmenopausal bleeding Patient has pre-op appointment today for hysterectomy scheduled Monday. She will betting pre-op lab work done at this appointment. No need to hold any medications that we are prescribing.     Return in about 3 months (around 12/12/2024) for a1c.    Kathryn Gartman D'Mello, DO Patient discussed with Dr. Lovie  "

## 2024-09-13 NOTE — Progress Notes (Signed)
 Interpreter: Hadassah Satterfield, CHERRI  PCP - Dr. Roetta Nooruddin Cardiologist - denies  PPM/ICD - Denies Device Orders - na Rep Notified - na  Chest x-ray - denies EKG - denies Stress Test - denies ECHO - denies Cardiac Cath - denies  Sleep Study - denies CPAP - na  Non-diabetic  Blood Thinner Instructions: denies Aspirin Instructions:denies  ERAS Protcol - Clears until 0745  Anesthesia review: No  Patient denies shortness of breath, fever, cough and chest pain at PAT appointment   All instructions explained to the patient, with a verbal understanding of the material. Patient agrees to go over the instructions while at home for a better understanding. Patient also instructed to self quarantine after being tested for COVID-19. The opportunity to ask questions was provided.

## 2024-09-13 NOTE — Assessment & Plan Note (Signed)
 Patient was started on atorvastatin  10 mg at her last visit. Has continued taking fenofibrate  48 mg. Will repeat lipid panel today, patient is fasting. Prevent score at last visit was noted to be 4.21% 10 year CVD and 24.23% 30 year risk. Did explain to patient with LDL still being high will likely need to increase dose of statin. Patient denies any muscle aches. States that she has been exercising and working on diet as well  Plan: Lipid panel Continue fenofibrate  48 mg and atorvastatin  10 mg for now( will likely increase this dose pending results)

## 2024-09-13 NOTE — Patient Instructions (Addendum)
 Hoy hablamos rohm and haas siguientes afecciones mdicas y [place] plan:  Ladora revisaremos el colesterol hoy. Probablemente aumente la dosis de charity fundraiser, chiropodist la prxima semana para informarle dole food y la dosis.  Esperamos verlo/la prxima vez. Si tiene alguna pregunta o inquietud, llame a nuestra clnica al [phone]. El mejor horario para llamar es de lunes a viernes de 9:00 a. m. a 4:00 p. m., pero hay alguien disponible las 24 horas, los 7 [address]  [address] de la [place]. Si necesita resurtidos de [employer], notifique a su farmacia con una semana de anticipacin y nos enviarn una solicitud.  Gracias por confiar en m. Le deseo lo mejor!  Abhi Moccia D'Mello, DO [facility]    Today we discussed the following medical conditions and plan:   We will check your cholesterol today. I will likely increase the dose of the medicine atorvastatin  but I will call you about these results and what the dose will be sometime next week.   We look forward to seeing you next time. Please call our clinic at [phone] if you have any questions or concerns. The best time to call is Monday-Friday from 9am-4pm, but there is someone available 24/7. If you need medication refills, please notify your pharmacy one week in advance and they will send us  a request.   Thank you for trusting me with your care. Wishing you the best!   Shakina Choy D'Mello, DO  [facility]

## 2024-09-13 NOTE — Assessment & Plan Note (Signed)
 Patient has pre-op appointment today for hysterectomy scheduled Monday. She will betting pre-op lab work done at this appointment. No need to hold any medications that we are prescribing.

## 2024-09-14 LAB — LIPID PANEL
Chol/HDL Ratio: 4 ratio (ref 0.0–4.4)
Cholesterol, Total: 190 mg/dL (ref 100–199)
HDL: 47 mg/dL
LDL Chol Calc (NIH): 115 mg/dL — ABNORMAL HIGH (ref 0–99)
Triglycerides: 159 mg/dL — ABNORMAL HIGH (ref 0–149)
VLDL Cholesterol Cal: 28 mg/dL (ref 5–40)

## 2024-09-17 ENCOUNTER — Observation Stay (HOSPITAL_COMMUNITY)
Admission: RE | Admit: 2024-09-17 | Discharge: 2024-09-18 | Disposition: A | Source: Home / Self Care | Attending: Obstetrics and Gynecology | Admitting: Obstetrics and Gynecology

## 2024-09-17 ENCOUNTER — Encounter (HOSPITAL_COMMUNITY): Admission: RE | Payer: Self-pay | Source: Home / Self Care

## 2024-09-17 ENCOUNTER — Ambulatory Visit (HOSPITAL_COMMUNITY): Admitting: Anesthesiology

## 2024-09-17 ENCOUNTER — Encounter (HOSPITAL_COMMUNITY): Payer: Self-pay | Admitting: Obstetrics and Gynecology

## 2024-09-17 DIAGNOSIS — Z90710 Acquired absence of both cervix and uterus: Principal | ICD-10-CM | POA: Diagnosis present

## 2024-09-17 LAB — ABO/RH: ABO/RH(D): O POS

## 2024-09-17 LAB — POCT PREGNANCY, URINE: Preg Test, Ur: NEGATIVE

## 2024-09-17 MED ORDER — SODIUM CHLORIDE 0.9 % IV SOLN
INTRAVENOUS | Status: AC
  Filled 2024-09-17: qty 2

## 2024-09-17 MED ORDER — SODIUM CHLORIDE 0.9 % IV SOLN
2.0000 g | Freq: Once | INTRAVENOUS | Status: AC
  Administered 2024-09-17: 2 g via INTRAVENOUS
  Filled 2024-09-17: qty 2

## 2024-09-17 MED ORDER — FENTANYL CITRATE (PF) 100 MCG/2ML IJ SOLN
INTRAMUSCULAR | Status: AC
  Filled 2024-09-17: qty 2

## 2024-09-17 MED ORDER — ONDANSETRON HCL 4 MG PO TABS: 4.0000 mg | ORAL_TABLET | Freq: Four times a day (QID) | ORAL | Status: DC | PRN

## 2024-09-17 MED ORDER — DEXAMETHASONE SOD PHOSPHATE PF 10 MG/ML IJ SOLN
INTRAMUSCULAR | Status: DC | PRN
  Administered 2024-09-17: 10 mg via INTRAVENOUS

## 2024-09-17 MED ORDER — IBUPROFEN 600 MG PO TABS
600.0000 mg | ORAL_TABLET | Freq: Four times a day (QID) | ORAL | Status: DC
  Administered 2024-09-18 (×2): 600 mg via ORAL
  Filled 2024-09-17 (×3): qty 1

## 2024-09-17 MED ORDER — MIDAZOLAM HCL (PF) 2 MG/2ML IJ SOLN
INTRAMUSCULAR | Status: DC | PRN
  Administered 2024-09-17: 2 mg via INTRAVENOUS

## 2024-09-17 MED ORDER — LACTATED RINGERS IV SOLN: INTRAVENOUS | Status: DC

## 2024-09-17 MED ORDER — GABAPENTIN 100 MG PO CAPS
200.0000 mg | ORAL_CAPSULE | Freq: Two times a day (BID) | ORAL | Status: DC
  Administered 2024-09-17 – 2024-09-18 (×2): 200 mg via ORAL
  Filled 2024-09-17 (×3): qty 2

## 2024-09-17 MED ORDER — SUGAMMADEX SODIUM 200 MG/2ML IV SOLN
INTRAVENOUS | Status: DC | PRN
  Administered 2024-09-17: 200 mg via INTRAVENOUS

## 2024-09-17 MED ORDER — FLUORESCEIN SODIUM 10 % IV SOLN
INTRAVENOUS | Status: DC | PRN
  Administered 2024-09-17: .25 mL via INTRAVENOUS

## 2024-09-17 MED ORDER — LIDOCAINE 2% (20 MG/ML) 5 ML SYRINGE
INTRAMUSCULAR | Status: DC | PRN
  Administered 2024-09-17: 80 mg via INTRAVENOUS

## 2024-09-17 MED ORDER — MIDAZOLAM HCL 2 MG/2ML IJ SOLN
INTRAMUSCULAR | Status: AC
  Filled 2024-09-17: qty 2

## 2024-09-17 MED ORDER — ORAL CARE MOUTH RINSE: 15.0000 mL | Freq: Once | OROMUCOSAL | Status: AC

## 2024-09-17 MED ORDER — SIMETHICONE 80 MG PO CHEW
80.0000 mg | CHEWABLE_TABLET | Freq: Three times a day (TID) | ORAL | Status: DC
  Administered 2024-09-17 – 2024-09-18 (×2): 80 mg via ORAL
  Filled 2024-09-17 (×2): qty 1

## 2024-09-17 MED ORDER — ONDANSETRON HCL 4 MG/2ML IJ SOLN: 4.0000 mg | Freq: Four times a day (QID) | INTRAMUSCULAR | Status: DC | PRN

## 2024-09-17 MED ORDER — LACTATED RINGERS IV SOLN: INTRAVENOUS | Status: DC | PRN

## 2024-09-17 MED ORDER — ONDANSETRON HCL 4 MG/2ML IJ SOLN
INTRAMUSCULAR | Status: DC | PRN
  Administered 2024-09-17: 4 mg via INTRAVENOUS

## 2024-09-17 MED ORDER — FLUORESCEIN SODIUM 10 % IV SOLN
INTRAVENOUS | Status: AC
  Filled 2024-09-17: qty 5

## 2024-09-17 MED ORDER — PROPOFOL 10 MG/ML IV BOLUS
INTRAVENOUS | Status: DC | PRN
  Administered 2024-09-17: 150 ug/kg/min via INTRAVENOUS
  Administered 2024-09-17: 150 mg via INTRAVENOUS

## 2024-09-17 MED ORDER — PHENYLEPHRINE HCL-NACL 20-0.9 MG/250ML-% IV SOLN: INTRAVENOUS | Status: DC | PRN

## 2024-09-17 MED ORDER — CHLORHEXIDINE GLUCONATE 0.12 % MT SOLN
15.0000 mL | Freq: Once | OROMUCOSAL | Status: AC
  Administered 2024-09-17: 15 mL via OROMUCOSAL
  Filled 2024-09-17: qty 15

## 2024-09-17 MED ORDER — KETOROLAC TROMETHAMINE 30 MG/ML IJ SOLN
INTRAMUSCULAR | Status: AC
  Filled 2024-09-17: qty 1

## 2024-09-17 MED ORDER — BUPIVACAINE HCL (PF) 0.5 % IJ SOLN
INTRAMUSCULAR | Status: AC
  Filled 2024-09-17: qty 30

## 2024-09-17 MED ORDER — DROPERIDOL 2.5 MG/ML IJ SOLN: 0.6250 mg | Freq: Once | INTRAMUSCULAR | Status: DC | PRN

## 2024-09-17 MED ORDER — SODIUM CHLORIDE 0.9 % IR SOLN
Status: DC | PRN
  Administered 2024-09-17: 1000 mL

## 2024-09-17 MED ORDER — ACETAMINOPHEN 500 MG PO TABS
1000.0000 mg | ORAL_TABLET | Freq: Once | ORAL | Status: AC
  Administered 2024-09-17: 1000 mg via ORAL
  Filled 2024-09-17: qty 2

## 2024-09-17 MED ORDER — HYDROMORPHONE HCL 1 MG/ML IJ SOLN
0.5000 mg | INTRAMUSCULAR | Status: AC | PRN
  Administered 2024-09-17 – 2024-09-18 (×2): 0.5 mg via INTRAVENOUS
  Filled 2024-09-17 (×2): qty 0.5

## 2024-09-17 MED ORDER — POLYETHYLENE GLYCOL 3350 17 G PO PACK
17.0000 g | PACK | Freq: Every day | ORAL | Status: DC
  Administered 2024-09-18: 17 g via ORAL
  Filled 2024-09-17: qty 1

## 2024-09-17 MED ORDER — KETOROLAC TROMETHAMINE 30 MG/ML IJ SOLN
INTRAMUSCULAR | Status: DC | PRN
  Administered 2024-09-17: 30 mg via INTRAVENOUS

## 2024-09-17 MED ORDER — OXYCODONE HCL 5 MG/5ML PO SOLN
5.0000 mg | Freq: Once | ORAL | Status: AC | PRN
  Administered 2024-09-17: 5 mg via ORAL

## 2024-09-17 MED ORDER — OXYCODONE HCL 5 MG PO TABS: 5.0000 mg | ORAL_TABLET | Freq: Once | ORAL | Status: AC | PRN

## 2024-09-17 MED ORDER — PROPOFOL 10 MG/ML IV BOLUS
INTRAVENOUS | Status: AC
  Filled 2024-09-17: qty 20

## 2024-09-17 MED ORDER — POVIDONE-IODINE 10 % EX SWAB
2.0000 | Freq: Once | CUTANEOUS | Status: AC
  Administered 2024-09-17: 2 via TOPICAL

## 2024-09-17 MED ORDER — FENTANYL CITRATE (PF) 100 MCG/2ML IJ SOLN
25.0000 ug | INTRAMUSCULAR | Status: DC | PRN
  Administered 2024-09-17: 25 ug via INTRAVENOUS
  Administered 2024-09-17: 50 ug via INTRAVENOUS
  Administered 2024-09-17: 25 ug via INTRAVENOUS

## 2024-09-17 MED ORDER — PHENYLEPHRINE 80 MCG/ML (10ML) SYRINGE FOR IV PUSH (FOR BLOOD PRESSURE SUPPORT)
PREFILLED_SYRINGE | INTRAVENOUS | Status: DC | PRN
  Administered 2024-09-17: 160 ug via INTRAVENOUS

## 2024-09-17 MED ORDER — ROCURONIUM BROMIDE 10 MG/ML (PF) SYRINGE
PREFILLED_SYRINGE | INTRAVENOUS | Status: DC | PRN
  Administered 2024-09-17: 20 mg via INTRAVENOUS
  Administered 2024-09-17: 50 mg via INTRAVENOUS
  Administered 2024-09-17: 20 mg via INTRAVENOUS

## 2024-09-17 MED ORDER — BUPIVACAINE HCL 0.5 % IJ SOLN
INTRAMUSCULAR | Status: DC | PRN
  Administered 2024-09-17: 15 mL

## 2024-09-17 MED ORDER — OXYCODONE HCL 5 MG/5ML PO SOLN
ORAL | Status: AC
  Filled 2024-09-17: qty 5

## 2024-09-17 MED ORDER — 0.9 % SODIUM CHLORIDE (POUR BTL) OPTIME
TOPICAL | Status: DC | PRN
  Administered 2024-09-17: 1000 mL

## 2024-09-17 MED ORDER — OXYCODONE HCL 5 MG PO TABS
5.0000 mg | ORAL_TABLET | ORAL | Status: DC | PRN
  Administered 2024-09-17 – 2024-09-18 (×2): 10 mg via ORAL
  Administered 2024-09-18: 5 mg via ORAL
  Filled 2024-09-17 (×2): qty 2
  Filled 2024-09-17: qty 1

## 2024-09-17 MED ORDER — FENTANYL CITRATE (PF) 250 MCG/5ML IJ SOLN
INTRAMUSCULAR | Status: DC | PRN
  Administered 2024-09-17: 75 ug via INTRAVENOUS
  Administered 2024-09-17: 5 ug via INTRAVENOUS

## 2024-09-17 NOTE — Brief Op Note (Signed)
 09/17/2024 10:55 AM  2:52 PM  PATIENT:  Kathryn Cannon  54 y.o. female  PRE-OPERATIVE DIAGNOSIS:  PMB  Fibroid  POST-OPERATIVE DIAGNOSIS:  PMB  Fibroid  PROCEDURE:  Procedures: HYSTERECTOMY, TOTAL, LAPAROSCOPIC, WITH SALPINGECTOMY (Bilateral) CYSTOSCOPY (N/A)  SURGEON:  Surgeons and Role:    * Izell Harari, MD - Primary  ASSISTANTS: LOIS Carolin MD   ANESTHESIA:   local and general  EBL:  50 mL   BLOOD ADMINISTERED:none  DRAINS: indwelling foley UOP   LOCAL MEDICATIONS USED:  MARCAINE      SPECIMEN:  cervix, uterus, fallopian tubes  DISPOSITION OF SPECIMEN:  PATHOLOGY  COUNTS:  YES  TOURNIQUET:  * No tourniquets in log *  DICTATION: .Note written in EPIC  PLAN OF CARE: Admit for overnight observation  PATIENT DISPOSITION:  PACU - hemodynamically stable.   Delay start of Pharmacological VTE agent (>24hrs) due to surgical blood loss or risk of bleeding: not applicable  Harari Izell Raddle MD Attending Center for [facility] 09/17/2024 Time: [phone]

## 2024-09-17 NOTE — Anesthesia Preprocedure Evaluation (Signed)
"                                    Anesthesia Evaluation  Patient identified by MRN, date of birth, ID band Patient awake    Reviewed: Allergy & Precautions, NPO status , Patient's Chart, lab work & pertinent test results  History of Anesthesia Complications Negative for: history of anesthetic complications  Airway Mallampati: II  TM Distance: >3 FB Neck ROM: Full    Dental no notable dental hx. (+) Teeth Intact   Pulmonary neg pulmonary ROS, neg sleep apnea, neg COPD, Patient abstained from smoking.Not current smoker   Pulmonary exam normal breath sounds clear to auscultation       Cardiovascular Exercise Tolerance: Good METS(-) hypertension(-) CAD and (-) Past MI negative cardio ROS (-) dysrhythmias  Rhythm:Regular Rate:Normal - Systolic murmurs    Neuro/Psych  Headaches  negative psych ROS   GI/Hepatic ,neg GERD  ,,(+)     (-) substance abuse    Endo/Other  neg diabetes    Renal/GU negative Renal ROS     Musculoskeletal   Abdominal   Peds  Hematology   Anesthesia Other Findings Past Medical History: No date: Abnormal uterine bleeding (AUB) No date: Fatty liver No date: Headache 03/2017: History of atypical hyperplasia of breast     Comment:  oncology--- dr lorriane  lindsey causey np;   dx 08/               2018  s/p left breast lumpectomy ,  left breast atypical               ductal hyperplasia,  started tamoxifen  10/ 2018 and               completed 5 yrs 10/ 2023 releasese by oncology No date: History of tamoxifen  therapy     Comment:  started 10/ 2018  completed 10/ 2023 No date: Hyperlipidemia No date: Pre-diabetes  Reproductive/Obstetrics                              Anesthesia Physical Anesthesia Plan  ASA: 2  Anesthesia Plan: General   Post-op Pain Management: Ofirmev  IV (intra-op)* and Toradol  IV (intra-op)*   Induction: Intravenous  PONV Risk Score and Plan: 4 or greater and Ondansetron ,  Dexamethasone , TIVA, Propofol  infusion and Midazolam   Airway Management Planned: Oral ETT  Additional Equipment: None  Intra-op Plan:   Post-operative Plan: Extubation in OR  Informed Consent: I have reviewed the patients History and Physical, chart, labs and discussed the procedure including the risks, benefits and alternatives for the proposed anesthesia with the patient or authorized representative who has indicated his/her understanding and acceptance.     Dental advisory given and Interpreter used for interview (in-person spanish interpreter at bedside)  Plan Discussed with: CRNA and Surgeon  Anesthesia Plan Comments: (Discussed risks of anesthesia with patient, including PONV, sore throat, lip/dental/eye damage. Rare risks discussed as well, such as cardiorespiratory and neurological sequelae, and allergic reactions. Discussed the role of CRNA in patient's perioperative care. Patient understands.)        Anesthesia Quick Evaluation  "

## 2024-09-17 NOTE — Transfer of Care (Signed)
 Immediate Anesthesia Transfer of Care Note  Patient: Kathryn Cannon  Procedure(s) Performed: HYSTERECTOMY, TOTAL, LAPAROSCOPIC, WITH SALPINGECTOMY (Bilateral: Abdomen) CYSTOSCOPY  Patient Location: PACU  Anesthesia Type:General  Level of Consciousness: awake, alert , oriented, and patient cooperative  Airway & Oxygen Therapy: Patient Spontanous Breathing and Patient connected to face mask oxygen  Post-op Assessment: Report given to RN and Post -op Vital signs reviewed and stable  Post vital signs: Reviewed and stable  Last Vitals:  Vitals Value Taken Time  BP 126/72   Temp    Pulse 71 09/17/24 15:02  Resp 22 09/17/24 15:02  SpO2 100 % 09/17/24 15:02  Vitals shown include unfiled device data.  Last Pain:  Vitals:   09/17/24 0828  TempSrc: Oral         Complications: No notable events documented.

## 2024-09-17 NOTE — Anesthesia Postprocedure Evaluation (Signed)
"   Anesthesia Post Note  Patient: Kathryn Cannon  Procedure(s) Performed: HYSTERECTOMY, TOTAL, LAPAROSCOPIC, WITH SALPINGECTOMY (Bilateral: Abdomen) CYSTOSCOPY     Patient location during evaluation: PACU Anesthesia Type: General Level of consciousness: awake and alert Pain management: pain level controlled Vital Signs Assessment: post-procedure vital signs reviewed and stable Respiratory status: spontaneous breathing, nonlabored ventilation, respiratory function stable and patient connected to nasal cannula oxygen Cardiovascular status: blood pressure returned to baseline and stable Postop Assessment: no apparent nausea or vomiting Anesthetic complications: no   No notable events documented.  Last Vitals:  Vitals:   09/17/24 1530 09/17/24 1545  BP: (!) 146/89 (!) 135/91  Pulse: (!) 59 76  Resp: 10 20  Temp:    SpO2: 98% 99%    Last Pain:  Vitals:   09/17/24 1530  TempSrc:   PainSc: Asleep                 Rome Ade      "

## 2024-09-18 ENCOUNTER — Telehealth: Payer: Self-pay | Admitting: Family Medicine

## 2024-09-18 ENCOUNTER — Ambulatory Visit: Payer: Self-pay

## 2024-09-18 ENCOUNTER — Other Ambulatory Visit (HOSPITAL_COMMUNITY): Payer: Self-pay

## 2024-09-18 ENCOUNTER — Encounter (HOSPITAL_COMMUNITY): Payer: Self-pay | Admitting: Obstetrics and Gynecology

## 2024-09-18 MED ORDER — SIMETHICONE 80 MG PO CHEW
CHEWABLE_TABLET | ORAL | 0 refills | Status: AC
  Filled 2024-09-18: qty 30, 30d supply, fill #0

## 2024-09-18 MED ORDER — ATORVASTATIN CALCIUM 40 MG PO TABS: 40.0000 mg | ORAL_TABLET | Freq: Every day | ORAL | 3 refills | Status: AC

## 2024-09-18 MED ORDER — POLYETHYLENE GLYCOL 3350 17 GM/SCOOP PO POWD
17.0000 g | Freq: Every day | ORAL | 0 refills | Status: AC | PRN
  Filled 2024-09-18: qty 238, 14d supply, fill #0

## 2024-09-18 MED ORDER — OXYCODONE HCL 5 MG PO TABS
5.0000 mg | ORAL_TABLET | Freq: Four times a day (QID) | ORAL | 0 refills | Status: AC | PRN
  Filled 2024-09-18: qty 30, 4d supply, fill #0

## 2024-09-18 MED ORDER — IBUPROFEN 600 MG PO TABS
600.0000 mg | ORAL_TABLET | Freq: Four times a day (QID) | ORAL | 1 refills | Status: AC | PRN
  Filled 2024-09-18: qty 30, 8d supply, fill #0

## 2024-09-18 MED ORDER — GABAPENTIN 100 MG PO CAPS
200.0000 mg | ORAL_CAPSULE | Freq: Two times a day (BID) | ORAL | 0 refills | Status: AC
  Filled 2024-09-18: qty 28, 7d supply, fill #0

## 2024-09-18 NOTE — Telephone Encounter (Signed)
 Called patient to let her know that we scheduled her post op appointment on 3/18 at 1:35. Patient did not answer so I left a detailed message with our call back number in case she needs to reschedule.

## 2024-09-18 NOTE — Discharge Summary (Signed)
 Gynecology Discharge Summary Date of Admission: 09/17/2024 Date of Discharge: 09/18/2024  The patient was admitted, as scheduled, and underwent a TLH/BS/cysto for persistent postmenopausal bleeding; please refer to operative note for full details.  She was meeting all post op goals, including flatus, along with a benign exam, and discharged to home on POD#1  In person interpreter used  Allergies as of 09/18/2024   No Known Allergies      Medication List     TAKE these medications    acetaminophen  500 MG tablet Commonly known as: TYLENOL  Take 1,000 mg by mouth every 8 (eight) hours as needed for headache or moderate pain (pain score 4-6).   atorvastatin  10 MG tablet Commonly known as: LIPITOR Take 1 tablet (10 mg total) by mouth daily.   fenofibrate  48 MG tablet Commonly known as: TRICOR  Take 1 tablet (48 mg total) by mouth daily.   gabapentin  100 MG capsule Commonly known as: NEURONTIN  Take 2 capsules (200 mg total) by mouth 2 (two) times daily.   ibuprofen  600 MG tablet Commonly known as: ADVIL  Take 1 tablet (600 mg total) by mouth every 6 (six) hours as needed for moderate pain (pain score 4-6), cramping or mild pain (pain score 1-3).   Magnesium Oxide -Mg Supplement 500 MG Caps Take 500 mg by mouth daily.   oxyCODONE  5 MG immediate release tablet Commonly known as: Oxy IR/ROXICODONE  Take 1-2 tablets (5-10 mg total) by mouth every 6 (six) hours as needed for moderate pain (pain score 4-6) or severe pain (pain score 7-10).   polyethylene glycol 17 g packet Commonly known as: MIRALAX  / GLYCOLAX  Take 17 g by mouth daily as needed for severe constipation or moderate constipation.   simethicone  80 MG chewable tablet Commonly known as: MYLICON 1 tablet by mouth bid x 2 days and then bid prn for gas pains        Future Appointments  Date Time Provider Department Center  12/11/2024  8:15 AM Edgardo Pontiff, DO [facility] [address]    Bebe Izell Raddle.  MD Attending [facility])

## 2024-09-18 NOTE — Progress Notes (Signed)
 Internal Medicine Clinic Attending  Case discussed with the resident at the time of the visit.  We reviewed the resident's history and exam and pertinent patient test results.  I agree with the assessment, diagnosis, and plan of care documented in the resident's note.

## 2024-09-18 NOTE — Progress Notes (Signed)
 Discharge instructions and prescriptions given to pt with interpreter present at bedside. Discussed post laparoscopic hysterectomy care, signs and symptoms to report to the MD, upcoming appointments, and meds. Pt verbalizes understanding and has no questions or concerns at this time. Pt discharged home from hospital in stable condition.

## 2024-09-18 NOTE — Discharge Instructions (Signed)
Laparoscopic Surgery Discharge Instructions  Instructions Following Major Laparoscopic Surgery You have just undergone a major laparoscopic surgery.  The following list should answer your most common questions.  Although we will discuss your surgery and post-operative instructions with you prior to your discharge, this list will serve as a reminder if you fail to recall the details of what we discussed.  We will discuss your surgery once again in detail at your post-op visit in two to four weeks. If you haven't already done so, please call to make your appointment as soon as possible.  How you will feel: Although you have just undergone a major surgery, your recovery will be significantly shorter since the surgery was performed through much smaller incisions than the traditional approach.  You should feel slightly better each day.  If you suddenly feel much worse than the prior day, please call the clinic.  It's important during the early part of your recovery that you maintain some activity.  Walking is encouraged.  You will quicken your recovery by continued activity.  Incision:  Your incisions will be closed with dissolvable stitches or surgical adhesive (glue).  There may be Band-aids and/or Steri-strips covering your incisions.  If there is no drainage from the incisions you may remove the Band-aids in one to two days.  You may notice some minor bruising at the incision sites.  This is common and will resolve within several days.  Please inform us if the redness at the edges of your incision appears to be spreading.  If the skin around your incision becomes warm to the touch, or if you notice a pus-like drainage, please call the office.  Vaginal Discharge Following a Laparoscopic Hysterectomy: Minor vaginal bleeding or spotting is normal following a hysterectomy.  Bleeding similar to the amount of your period is excessive, and you should inform us of this immediately.  Vaginal spotting may continue  for several weeks following your surgery.  You may notice a yellowish discharge which occasionally occurs as the vaginal stitches dissolve, and may last for several weeks.  Sexual Activity Following a Hysterectomy: Do not have sexual intercourse or place tampons or douches in the vagina prior to your first office visit.  We will discuss when you may resume these activities at that visit.    Stairs/Driving/Activities: You may climb stairs if necessary.  If you've had general anesthesia, do not drive a car the rest of the day today.  You may begin light housework when you feel up to it, but avoid heavy lifting (more than 15-20lbs) or pushing until cleared for these activities by your physician.  Hygiene:  Do not soak your incisions.  Showers are acceptable but you may not take a bath or swim in a pool.  Cleanse your incisions daily with soap and water.  Medications:  Please resume taking any medications that you were taking prior to the surgery.  If we have prescribed any new medications for you, please take them as directed.  Constipation:  It is fairly common to experience some difficulty in moving your bowels following major surgery.  Being active will help to reduce this likelihood. A diet rich in fiber and plenty of liquids is desirable.  If you do become constipated, a mild laxative such as Miralax, Milk of Magnesia, or Metamucil, or a stool softener such as Colace, is recommended.  General Instructions: If you develop a fever of 100.5 degrees or higher, please call the office number(s) below for physician on call.   

## 2024-10-31 ENCOUNTER — Ambulatory Visit: Payer: Self-pay | Admitting: Obstetrics and Gynecology

## 2024-12-11 ENCOUNTER — Ambulatory Visit: Payer: Self-pay
